# Patient Record
Sex: Female | Born: 1980 | Race: Black or African American | Hispanic: No | Marital: Single | Smoking: Current every day smoker
Health system: Southern US, Community
[De-identification: ages and names within clinical notes are randomized; demographics above are authoritative.]

## PROBLEM LIST (undated history)

## (undated) DIAGNOSIS — D649 Anemia, unspecified: Secondary | ICD-10-CM

## (undated) HISTORY — PX: TUBAL LIGATION: SHX77

---
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
## 2001-10-20 ENCOUNTER — Inpatient Hospital Stay (HOSPITAL_COMMUNITY): Admission: AD | Admit: 2001-10-20 | Discharge: 2001-10-20 | Payer: Self-pay | Admitting: Obstetrics

## 2001-11-10 ENCOUNTER — Inpatient Hospital Stay (HOSPITAL_COMMUNITY): Admission: AD | Admit: 2001-11-10 | Discharge: 2001-11-12 | Payer: Self-pay | Admitting: Obstetrics

## 2001-11-10 ENCOUNTER — Encounter (INDEPENDENT_AMBULATORY_CARE_PROVIDER_SITE_OTHER): Payer: Self-pay | Admitting: Specialist

## 2002-12-17 ENCOUNTER — Ambulatory Visit (HOSPITAL_COMMUNITY): Admission: RE | Admit: 2002-12-17 | Discharge: 2002-12-17 | Payer: Self-pay | Admitting: Obstetrics

## 2003-02-28 ENCOUNTER — Emergency Department (HOSPITAL_COMMUNITY): Admission: EM | Admit: 2003-02-28 | Discharge: 2003-02-28 | Payer: Self-pay | Admitting: Emergency Medicine

## 2003-02-28 ENCOUNTER — Encounter: Payer: Self-pay | Admitting: Emergency Medicine

## 2004-06-09 ENCOUNTER — Ambulatory Visit: Payer: Self-pay | Admitting: Family Medicine

## 2004-07-15 ENCOUNTER — Other Ambulatory Visit: Admission: RE | Admit: 2004-07-15 | Discharge: 2004-07-15 | Payer: Self-pay | Admitting: Family Medicine

## 2004-07-15 ENCOUNTER — Ambulatory Visit: Payer: Self-pay | Admitting: Sports Medicine

## 2004-07-19 ENCOUNTER — Ambulatory Visit: Payer: Self-pay | Admitting: Family Medicine

## 2005-05-11 ENCOUNTER — Other Ambulatory Visit: Admission: RE | Admit: 2005-05-11 | Discharge: 2005-05-11 | Payer: Self-pay | Admitting: Family Medicine

## 2005-05-11 ENCOUNTER — Encounter (INDEPENDENT_AMBULATORY_CARE_PROVIDER_SITE_OTHER): Payer: Self-pay | Admitting: Specialist

## 2005-05-11 ENCOUNTER — Ambulatory Visit: Payer: Self-pay | Admitting: Family Medicine

## 2005-06-12 ENCOUNTER — Ambulatory Visit: Payer: Self-pay | Admitting: Family Medicine

## 2005-06-13 ENCOUNTER — Ambulatory Visit: Payer: Self-pay | Admitting: Family Medicine

## 2005-06-29 ENCOUNTER — Ambulatory Visit: Payer: Self-pay | Admitting: Family Medicine

## 2005-07-26 ENCOUNTER — Emergency Department (HOSPITAL_COMMUNITY): Admission: EM | Admit: 2005-07-26 | Discharge: 2005-07-26 | Payer: Self-pay | Admitting: Family Medicine

## 2005-07-27 ENCOUNTER — Ambulatory Visit: Payer: Self-pay | Admitting: Family Medicine

## 2005-08-24 ENCOUNTER — Emergency Department (HOSPITAL_COMMUNITY): Admission: EM | Admit: 2005-08-24 | Discharge: 2005-08-24 | Payer: Self-pay | Admitting: Family Medicine

## 2005-11-21 ENCOUNTER — Encounter (INDEPENDENT_AMBULATORY_CARE_PROVIDER_SITE_OTHER): Payer: Self-pay | Admitting: *Deleted

## 2005-11-21 LAB — CONVERTED CEMR LAB

## 2005-12-08 ENCOUNTER — Ambulatory Visit: Payer: Self-pay | Admitting: Family Medicine

## 2005-12-20 ENCOUNTER — Ambulatory Visit: Payer: Self-pay | Admitting: Family Medicine

## 2005-12-20 ENCOUNTER — Encounter (INDEPENDENT_AMBULATORY_CARE_PROVIDER_SITE_OTHER): Payer: Self-pay | Admitting: Specialist

## 2005-12-20 ENCOUNTER — Other Ambulatory Visit: Admission: RE | Admit: 2005-12-20 | Discharge: 2005-12-20 | Payer: Self-pay | Admitting: Family Medicine

## 2006-02-13 ENCOUNTER — Ambulatory Visit: Payer: Self-pay | Admitting: Family Medicine

## 2006-05-14 ENCOUNTER — Ambulatory Visit: Payer: Self-pay | Admitting: Family Medicine

## 2006-06-01 ENCOUNTER — Ambulatory Visit: Payer: Self-pay | Admitting: Family Medicine

## 2006-06-28 ENCOUNTER — Ambulatory Visit: Payer: Self-pay | Admitting: Gynecology

## 2006-07-04 ENCOUNTER — Ambulatory Visit: Payer: Self-pay | Admitting: Obstetrics and Gynecology

## 2006-07-04 ENCOUNTER — Other Ambulatory Visit: Admission: RE | Admit: 2006-07-04 | Discharge: 2006-07-04 | Payer: Self-pay | Admitting: Obstetrics and Gynecology

## 2006-07-18 ENCOUNTER — Ambulatory Visit: Payer: Self-pay | Admitting: Obstetrics & Gynecology

## 2006-09-08 ENCOUNTER — Emergency Department (HOSPITAL_COMMUNITY): Admission: EM | Admit: 2006-09-08 | Discharge: 2006-09-08 | Payer: Self-pay | Admitting: Family Medicine

## 2006-09-20 DIAGNOSIS — J309 Allergic rhinitis, unspecified: Secondary | ICD-10-CM | POA: Insufficient documentation

## 2006-09-20 DIAGNOSIS — E669 Obesity, unspecified: Secondary | ICD-10-CM

## 2006-09-20 DIAGNOSIS — F329 Major depressive disorder, single episode, unspecified: Secondary | ICD-10-CM

## 2006-09-20 DIAGNOSIS — F17200 Nicotine dependence, unspecified, uncomplicated: Secondary | ICD-10-CM | POA: Insufficient documentation

## 2006-09-21 ENCOUNTER — Encounter (INDEPENDENT_AMBULATORY_CARE_PROVIDER_SITE_OTHER): Payer: Self-pay | Admitting: *Deleted

## 2007-03-02 ENCOUNTER — Emergency Department (HOSPITAL_COMMUNITY): Admission: EM | Admit: 2007-03-02 | Discharge: 2007-03-02 | Payer: Self-pay | Admitting: Emergency Medicine

## 2007-03-11 ENCOUNTER — Encounter: Payer: Self-pay | Admitting: *Deleted

## 2007-05-10 ENCOUNTER — Encounter: Payer: Self-pay | Admitting: Obstetrics & Gynecology

## 2007-05-10 ENCOUNTER — Ambulatory Visit: Payer: Self-pay | Admitting: Obstetrics & Gynecology

## 2007-05-10 LAB — CONVERTED CEMR LAB: Pap Smear: ABNORMAL

## 2007-05-26 ENCOUNTER — Emergency Department (HOSPITAL_COMMUNITY): Admission: EM | Admit: 2007-05-26 | Discharge: 2007-05-26 | Payer: Self-pay | Admitting: Family Medicine

## 2007-09-21 ENCOUNTER — Emergency Department (HOSPITAL_COMMUNITY): Admission: EM | Admit: 2007-09-21 | Discharge: 2007-09-21 | Payer: Self-pay | Admitting: Family Medicine

## 2007-09-27 ENCOUNTER — Encounter: Payer: Self-pay | Admitting: Family Medicine

## 2008-03-04 ENCOUNTER — Emergency Department (HOSPITAL_COMMUNITY): Admission: EM | Admit: 2008-03-04 | Discharge: 2008-03-04 | Payer: Self-pay | Admitting: Emergency Medicine

## 2008-03-17 ENCOUNTER — Encounter: Payer: Self-pay | Admitting: *Deleted

## 2008-07-08 ENCOUNTER — Emergency Department (HOSPITAL_COMMUNITY): Admission: EM | Admit: 2008-07-08 | Discharge: 2008-07-08 | Payer: Self-pay | Admitting: Emergency Medicine

## 2008-07-08 ENCOUNTER — Telehealth: Payer: Self-pay | Admitting: *Deleted

## 2008-09-10 ENCOUNTER — Encounter: Payer: Self-pay | Admitting: Family Medicine

## 2008-09-10 ENCOUNTER — Ambulatory Visit: Payer: Self-pay | Admitting: Family Medicine

## 2008-09-10 LAB — CONVERTED CEMR LAB
Bilirubin Urine: NEGATIVE
Blood in Urine, dipstick: NEGATIVE
Chlamydia, DNA Probe: NEGATIVE
GC Probe Amp, Genital: NEGATIVE
Glucose, Urine, Semiquant: NEGATIVE
Ketones, urine, test strip: NEGATIVE
Nitrite: NEGATIVE
Protein, U semiquant: NEGATIVE
Specific Gravity, Urine: 1.02
Urobilinogen, UA: 1
WBC Urine, dipstick: NEGATIVE
pH: 7

## 2008-09-20 ENCOUNTER — Encounter: Payer: Self-pay | Admitting: Family Medicine

## 2009-03-09 ENCOUNTER — Encounter: Payer: Self-pay | Admitting: *Deleted

## 2009-03-09 ENCOUNTER — Telehealth: Payer: Self-pay | Admitting: *Deleted

## 2009-06-21 ENCOUNTER — Emergency Department (HOSPITAL_COMMUNITY): Admission: EM | Admit: 2009-06-21 | Discharge: 2009-06-21 | Payer: Self-pay | Admitting: Family Medicine

## 2009-08-05 ENCOUNTER — Other Ambulatory Visit: Admission: RE | Admit: 2009-08-05 | Discharge: 2009-08-05 | Payer: Self-pay | Admitting: Family Medicine

## 2009-08-05 ENCOUNTER — Ambulatory Visit: Payer: Self-pay | Admitting: Family Medicine

## 2009-08-05 ENCOUNTER — Encounter: Payer: Self-pay | Admitting: Family Medicine

## 2009-08-05 LAB — CONVERTED CEMR LAB

## 2009-08-16 ENCOUNTER — Telehealth: Payer: Self-pay | Admitting: *Deleted

## 2009-08-17 ENCOUNTER — Ambulatory Visit: Payer: Self-pay | Admitting: Family Medicine

## 2009-08-17 LAB — CONVERTED CEMR LAB
Bilirubin Urine: NEGATIVE
Glucose, Urine, Semiquant: NEGATIVE
Specific Gravity, Urine: 1.02
Urobilinogen, UA: 0.2
Whiff Test: NEGATIVE

## 2009-11-11 ENCOUNTER — Ambulatory Visit: Payer: Self-pay | Admitting: Family Medicine

## 2009-11-11 ENCOUNTER — Ambulatory Visit (HOSPITAL_COMMUNITY): Admission: RE | Admit: 2009-11-11 | Discharge: 2009-11-11 | Payer: Self-pay | Admitting: Family Medicine

## 2009-11-11 LAB — CONVERTED CEMR LAB
Nitrite: NEGATIVE
Protein, U semiquant: NEGATIVE
pH: 5.5

## 2009-11-18 ENCOUNTER — Telehealth: Payer: Self-pay | Admitting: Family Medicine

## 2010-07-14 ENCOUNTER — Encounter: Payer: Self-pay | Admitting: Family Medicine

## 2010-08-25 NOTE — Assessment & Plan Note (Signed)
Summary: hurt leg & discharge,df   Vital Signs:  Patient profile:   30 year old female Weight:      232 pounds Temp:     98.6 degrees F Pulse rate:   81 / minute BP sitting:   112 / 73  Vitals Entered By: Jone Baseman CMA (November 11, 2009 3:46 PM)  CC: Hurt leg and discharge Is Patient Diabetic? No Pain Assessment Patient in pain? no        Primary Care Provider:  Eustaquio Boyden  MD  CC:  Hurt leg and discharge.  History of Present Illness: CC: leg pain and discharge  1. leg pain - 2 mo ago fell down stairs (8 stairs).  Never iced, did take motrin/ibuprofen.  Initially had big bruise, always had bad swelling, but pain improved.  Started hurting again 1 wk ago.  No pain with gait, more with pushing or hitting leg there.  2. discharge - several day h/o vag discharge, itching, slight dysuria.  No odor.  "cottage cheese like".  + urgency but not increased frequency.  3. smoking - 1/4 ppd.    Habits & Providers  Alcohol-Tobacco-Diet     Tobacco Status: current     Tobacco Counseling: to quit use of tobacco products     Cigarette Packs/Day: 0.25  Current Medications (verified): 1)  Fluconazole 150 Mg Tabs (Fluconazole) .... One By Mouth X 1  Allergies (verified): 1)  ! Penicillin  Past History:  Past Medical History: BV (11/05) (07/2009) H/o anemia w/ G3 NSVD x 3 h/o cervical dysplasia PMH-FH-SH reviewed for relevance  Social History: Packs/Day:  0.25  Physical Exam  General:  alert, well-developed, and well-nourished.  alert, well-developed, and well-nourished.   Genitalia:  Normal introitus for age, no external lesions, no vaginal discharge, mucosa pink and moist, no vaginal or cervical lesions, no vaginal atrophy, no friaility or hemorrhage, slight white discharge. Msk:  tender swelling over lateral left leg, mid fibular.  No pain at ankle or knee.   Pulses:  2+ periph pulses Extremities:  No clubbing, cyanosis, edema, or deformity noted with normal  full range of motion of all joints.   Skin:  Intact without suspicious lesions or rashes   Impression & Recommendations:  Problem # 1:  LEUKORRHEA (ICD-623.5) wet prep negative for yeast, however pt endorses "cottage cheese" discharge.  Will treat with diflucan. The following medications were removed from the medication list:    Metrogel-vaginal 0.75 % Gel (Metronidazole) ..... Use 1 applicatorful in your vagina every night for 5 days in  Orders: Wet Prep- [facility] (87210) [facility]- Est  Level 4 (16109)  Problem # 2:  LEG PAIN, LEFT (ICD-729.5) obtain xray of leg to eval.  if hematoma, may slowly resolve.  If mid fibular fracture, may need further intervention.  For now, nsaids.  cell phone [phone]  Orders: Diagnostic X-Ray/Fluoroscopy (Diagnostic X-Ray/Flu) [facility]- Est  Level 4 (81191)  Problem # 3:  DYSURIA (ICD-788.1)  UA - mod LE and 5-10 WBC.  Treat as UTI given some sxs with macrobid x 5 days (all to PCN so held off on keflex).  no concern for pyelo.  Orders: Urinalysis-[facility] (00000) [facility]- Est  Level 4 (47829)  Her updated medication list for this problem includes:    Nitrofurantoin Monohyd Macro 100 Mg Caps (Nitrofurantoin monohyd macro) ..... One by mouth two times a day x 5 days  Complete Medication List: 1)  Fluconazole 150 Mg Tabs (Fluconazole) .... One by mouth x 1 2)  Nitrofurantoin Monohyd Macro 100 Mg Caps (Nitrofurantoin monohyd macro) .... One by mouth two times a day x 5 days  Patient Instructions: 1)  Sounds like you have a yeast infection.  However, wait until I call you to fill the medicine because we have also checked a urinalysis today. 2)  we will get xrays of your left leg. 3)  Try ibuprofen for the let pain, as well as icing for no more than 15 min at a time. Prescriptions: NITROFURANTOIN MONOHYD MACRO 100 MG CAPS (NITROFURANTOIN MONOHYD MACRO) one by mouth two times a day x 5 days  #10 x 0   Entered and Authorized by:   Eustaquio Boyden  MD   Signed by:    Eustaquio Boyden  MD on 11/11/2009   Method used:   Faxed to ...       [facility] Department (retail)       [address] [place], [place]  [address]       Ph: [phone]       Fax: [phone]   RxID:   [id number] FLUCONAZOLE 150 MG TABS (FLUCONAZOLE) one by mouth x 1  #1 x 0   Entered and Authorized by:   Eustaquio Boyden  MD   Signed by:   Eustaquio Boyden  MD on 11/11/2009   Method used:   Faxed to ...       [facility] Department (retail)       [address] [place], [place]  [address]       Ph: [phone]       Fax: [phone]   RxID:   [id number]    Prevention & Chronic Care Immunizations   Influenza vaccine: Not documented    Tetanus booster: 04/23/2006: Done.   Tetanus booster due: 04/23/2016    Pneumococcal vaccine: Not documented  Other Screening   Pap smear: NEGATIVE FOR INTRAEPITHELIAL LESIONS OR MALIGNANCY.  (08/05/2009)   Pap smear due: 11/08/2007   Smoking status: current  (11/11/2009)   Smoking cessation counseling: yes  (09/10/2008)  Laboratory Results   Urine Tests  Date/Time Received: November 11, 2009 4:47 PM  Date/Time Reported: November 11, 2009 5:13 PM   Routine Urinalysis   Color: yellow Appearance: Clear Glucose: negative   (Normal Range: Negative) Bilirubin: negative   (Normal Range: Negative) Ketone: negative   (Normal Range: Negative) Spec. Gravity: 1.025   (Normal Range: 1.003-1.035) Blood: trace-intact   (Normal Range: Negative) pH: 5.5   (Normal Range: 5.0-8.0) Protein: negative   (Normal Range: Negative) Urobilinogen: 0.2   (Normal Range: 0-1) Nitrite: negative   (Normal Range: Negative) Leukocyte Esterace: moderate   (Normal Range: Negative)  Urine Microscopic WBC/HPF: 5-12 RBC/HPF: 0-3 Bacteria/HPF: 1+ Epithelial/HPF: 1-5    Comments: biochemical ...........test performed by..........Marland Kitchen Ashlee Dais, SMA micro ...............test performed by......Marland KitchenBonnie A. Swazil[place], MLS  (ASCP)cm  Date/Time Received: November 11, 2009 4:20 PM  Date/Time Reported: November 11, 2009 4:50 PM   [employer] Source: vag WBC/hpf: 0-2 Bacteria/hpf: 1+  Cocci Clue cells/hpf: rare Yeast/hpf: none Trichomonas/hpf: none Comments: ...........test performed by..........Marland Kitchen [place], SMA

## 2010-08-25 NOTE — Assessment & Plan Note (Signed)
Summary: f/u pap,df   Vital Signs:  Patient profile:   30 year old female Weight:      226.3 pounds Temp:     98.1 degrees F oral Pulse rate:   91 / minute Pulse rhythm:   regular BP sitting:   129 / 86  (left arm) Cuff size:   large  Vitals Entered By: Loralee Pacas CMA (August 05, 2009 9:35 AM) CC: cpe with pap   Primary Care Provider:  Eustaquio Boyden  MD  CC:  cpe with pap.  History of Present Illness: Pt has noticed after intercourse that she has an odor.  + discharge. Would also like STI testing.  Sx present for 2 months.  Uses feminine washes at times.  Had BTL in 2004.  LMP was December 16th.  Usually very regular.  Missed one month a few months ago, but regular since then.   Smoking.  Has tried to quit in the past.  Considering quitting again.  Concerned about gaining weight.  Current Medications (verified): 1)  None  Allergies (verified): 1)  ! Penicillin  Review of Systems       see HPI  Physical Exam  General:  Obese.  No distress.  Vitals reviewed. Genitalia:  Pelvic Exam:        External: normal female genitalia without lesions or masses        Vagina: normal without lesions or masses.  scant discharge noted.        Cervix: normal without lesions or masses        Adnexa: normal bimanual exam without masses or fullness        Uterus: normal by palpation.  No CMT        Pap smear: performed.  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  Check pap today ers: Pap Smear-[facility] ([id number])  Problem # 2:  BACTERIAL VAGINITIS (ICD-616.10)  Pt unable to decide gel vs. oral rx.  Both rx faxed to pharmacy so she can choose based on price and availability. Her updated medication list for this problem includes:    Metronidazole 500 Mg Tabs (Metronidazole) .Marland Kitchen... Take 1 by mouth twice daily for the infection.    Metrogel-vaginal  0.75 % Gel (Metronidazole) ..... Use 1 applicatorful in your vagina every night for 5 days in  Orders: [facility]- Est Level  3 (16109)  Problem # 3:  TOBACCO DEPENDENCE (ICD-305.1)  Counseled on cessation.  Offered classes at [facility] and [employer].  Orders: [facility]- Est Level  3 (99213)  Problem # 4:  SEXUALLY TRANSMITTED DISEASE, EXPOSURE TO (ICD-V01.6)  Check Gc/Cz.  PT declined blood draw for RPR and HIV.  Condoms advised.  Advised  Metrogel may interfere with condoms.  Orders: [facility]- Est Level  3 (60454)  Complete Medication List: 1)  Metronidazole 500 Mg Tabs (Metronidazole) .... Take 1 by mouth twice daily for the infection. 2)  Metrogel-vaginal 0.75 % Gel (Metronidazole) .... Use 1 applicatorful in your vagina every night for 5 days in  [facility]: [facility] GC/Chlamydia-[facility] (87591/87491) Prescriptions: METROGEL-VAGINAL 0.75 % GEL (METRONIDAZOLE) Use 1 applicatorful in your vagina every night for 5 days in  #1 tube x 0   Entered and Authorized by:   Sarah Swaziland MD   Signed by:   Sarah Swaziland MD on 08/05/2009   Method used:   Faxed to .Marland KitchenMarland Kitchen  [facility] (retail)             [place], [place]         Ph:        Fax: [phone]   RxID:   [id number] METRONIDAZOLE 500 MG TABS (METRONIDAZOLE) Take 1 by mouth twice daily for the infection.  #14 x 0   Entered and Authorized by:   Sarah Swaziland MD   Signed by:   Sarah Swaziland MD on 08/05/2009   Method used:   Faxed to ...       [facility] (retail)             [place], [place]         Ph:        Fax: [phone]   RxID:   [id number]   Laboratory Results  Date/Time Received: August 05, 2009 10:14 AM  Date/Time Reported: August 05, 2009 10:29 AM   [place] Source: vaginal WBC/hpf: 0-3 Bacteria/hpf: 3+  Cocci Clue cells/hpf: few  Positive whiff Yeast/hpf: none Trichomonas/hpf: none Comments: ...........test performed by...........Marland KitchenTerese Door, CMA

## 2010-08-25 NOTE — Progress Notes (Signed)
Summary: results  Phone Note Call from Patient Call back at [phone]   Caller: Patient Summary of Call: pt is wanting results of xray Initial call taken by: De Nurse,  November 18, 2009 11:52 AM  Follow-up for Phone Call        told her negative for fx or dislocation Follow-up by: Golden Circle RN,  November 18, 2009 11:58 AM  Additional Follow-up for Phone Call Additional follow up Details #1::        thanks.  wanted to discuss soft tissue in xray - looks like still has swelling in soft tissue, and if bothering her, could consider PT to left leg.  will see what she thinks. Additional Follow-up by: Eustaquio Boyden  MD,  November 18, 2009 12:14 PM    Additional Follow-up for Phone Call Additional follow up Details #2::    talked with patient.  if worsening, will set up with PT for leg.  recommendedwarm compresses and NSAIDs.  disucssed likely hematoma in soft tissue, discussed difference between this and blood clots. Follow-up by: Eustaquio Boyden  MD,  November 19, 2009 11:03 AM

## 2010-08-25 NOTE — Assessment & Plan Note (Signed)
Summary: vag irritation/[place]/Gutierrez   Vital Signs:  Patient profile:   30 year old female Weight:      227.3 pounds Temp:     98.7 degrees F oral Pulse rate:   73 / minute BP sitting:   117 / 77  (right arm) Cuff size:   large  Vitals Entered By: Garen Grams LPN (August 17, 2009 10:04 AM) CC: possible yeast inf, Vaginal discharge Is Patient Diabetic? No Pain Assessment Patient in pain? no        Primary Care Provider:  Eustaquio Boyden  MD  CC:  possible yeast inf and Vaginal discharge.  History of Present Illness:  Vaginal discharge      This is a 30 year old woman who presents with Vaginal discharge.  The patient complains of itching and vaginal burning, but denies frequency, urgency, fever, pelvic pain, and back pain.  The discharge is described as white.  Risk factors for vaginal discharge include recent antibiotics and missed period.  The patient denies the following symptoms: genital sores, painful intercourse, rash, and headache.  Prior treatment has included metronidazole.   Not on any birth control, sexually active, no condom use.  LMP 07/08/2010. Cycles are normally very regular.  Hers is 9 days late.  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 Use 1 Applicatorful in  Your Vagina Every Night For 5 Days in  Allergies (verified): 1)  ! Penicillin  Past History:  Past Medical History: Last updated: 09/10/2008 BV (11/05) H/o anemia w/ G3 NSVD x 3 h/o cervical dysplasia  Past Surgical History: Last updated: 09/10/2008 BTL (2004)  Colposcopy: CIN I, II   7/07 Hgb 13.7, random glucose 64, Cr 0.8 - 06/19/2004 LEEP 12/07 CINI-CINII TSH = 1.634 - 06/19/2004  Family History: Last updated: 09/10/2008 Father--living; healthy GGM--DM, Large FHx of HTN Mother--living; CAD, asthma, DJD  Social History: Last updated: 09/10/2008 -Single, but in relationship with w/ father of G2 & G3 who is incarcerated -Lives w/ her 3 daughters (born in 1999, 2000, 2003) -works at [employer]; receives child support from father of G1 -Has GED; went to [employer] x 1 semester; 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    Physical Exam  General:  alert, well-developed, and well-nourished.  alert, well-developed, and well-nourished.   Head:  normocephalic and atraumatic.  normocephalic and atraumatic.   Abdomen:  soft, non-tender, and normal bowel sounds.  soft,  non-tender, and normal bowel sounds.   Genitalia:  normal introitus.  normal introitus.  Minimal vaginal disharge.     Impression & Recommendations:  Problem # 1:  BACTERIAL VAGINITIS (ICD-616.10) Wet prep is not c/w yeast or BV.  There is an occasional yeast but many bacteria and WBC's, and sperm.  No clue cells or trich  noted.  UPT is negative.  Will refrain from treatment at this time.  Pt. may use OTC medication for yeast, but I am unconvinced of this diagnosis. Good vaginal health measures emphasized with the pt.  discussed results by phone.  She is ok with no treatment at present time.  Complete Medication List: 1)  Metrogel-vaginal 0.75 % Gel (Metronidazole) .... Use 1 applicatorful in your vagina every night for 5 days in  Other Orders: Urinalysis-[facility] (00000) U Preg-[facility] (98119) GC/Chlamydia-[facility] (87591/87491) [facility]  Patient Instructions: 1)  Please schedule a follow-up appointment as needed .   Laboratory Results   Urine Tests  Date/Time Received: August 17, 2009 10:55 AM  Date/Time Reported: August 17, 2009 11:38 AM   Routine Urinalysis   Color: yellow Appearance: Clear Glucose: negative   (Normal Range: Negative) Bilirubin: negative   (Normal Range: Negative) Ketone: negative   (Normal Range: Negative) Spec. Gravity: 1.020   (Normal Range: 1.003-1.035) Blood: trace-intact   (Normal Range: Negative) pH: 7.0   (Normal Range: 5.0-8.0) Protein: negative   (Normal Range: Negative) Urobilinogen: 0.2   (Normal Range: 0-1) Nitrite: negative   (Normal Range: Negative) Leukocyte Esterace: moderate   (Normal Range: Negative)  Urine Microscopic WBC/HPF: 10-20 RBC/HPF: 1-5 Bacteria/HPF: 2+ Epithelial/HPF: 10-20 Casts/LPF: triple phosphate crystals    Urine HCG: negative Comments: biochemical ............test performed by...........Marland Kitchen Terese Door, CMA micro ...............test performed by......Marland KitchenBonnie A. Swazil[place], MLS (ASCP)cm  Date/Time Received: August 17, 2009 11:17 AM  Date/Time Reported: August 17, 2009 11:39 AM   [place] Source: vag WBC/hpf: >20 Bacteria/hpf: 3+ cocci and rods Clue cells/hpf: none  Negative whiff Yeast/hpf: few Trichomonas/hpf: none Comments: sperm present, few intermediate and parabasal cells present ...............test performed  by......Marland KitchenBonnie A. [place], MLS (ASCP)cm      Appended Document: vag irritation/[place]/Gutierrez

## 2010-08-25 NOTE — Miscellaneous (Signed)
  Clinical Lists Changes  Problems: Removed problem of DYSURIA (ICD-788.1) Removed problem of LEG PAIN, LEFT (ICD-729.5) Removed problem of LEUKORRHEA (ICD-623.5) Removed problem of CONTRACEPTIVE MANAGEMENT (ICD-V25.09) Removed problem of SEXUALLY TRANSMITTED DISEASE, EXPOSURE TO (ICD-V01.6) Removed problem of BACTERIAL VAGINITIS (ICD-616.10) Removed problem of VULVAR ABSCESS (ICD-616.4) Removed problem of SKIN RASH (ICD-782.1) Removed problem of PERSONAL HISTORY OF CERVICAL DYSPLASIA (ICD-V13.22) Removed problem of CONTACT OR EXPOSURE TO OTHER VIRAL DISEASES (ICD-V01.79) Removed problem of SCREENING FOR MALIGNANT NEOPLASM OF THE CERVIX (ICD-V76.2) Removed problem of DYSURIA (ICD-788.1)

## 2010-08-25 NOTE — Progress Notes (Signed)
Summary: triage  Phone Note Call from Patient Call back at [phone]   Caller: Patient Summary of Call: Pt took rx for a bacterial inf now thinks it has given her a yeast inf.  Does she have to come in or can something be called in for her. Initial call taken by: Clydell Hakim,  August 16, 2009 1:40 PM  Follow-up for Phone Call        appt at 8:30am tomorrow Follow-up by: Golden Circle RN,  August 16, 2009 2:10 PM

## 2010-09-14 ENCOUNTER — Encounter: Payer: Self-pay | Admitting: *Deleted

## 2010-12-06 NOTE — Group Therapy Note (Signed)
Cynthia Mcclure, Cynthia Mcclure NO.:  [id number]   MEDICAL RECORD NO.:  [id number]          PATIENT TYPE:  WOC   LOCATION:  [facility]                   FACILITY:  [facility]   PHYSICIAN:  Johnella Moloney, MD        DATE OF BIRTH:  05/13/1981   DATE OF SERVICE:  05/10/2007                                  CLINIC NOTE   The patient is a 30 year old gravida 3, para 3 who is here for a  followup Pap smear.  The patient did have a LEEP in December of 2007,  and the pathology revealed variable dysplasia ranging from CIN1 to focal  CIN2, and dysplasia involved in cervical margin and also endocervical  glands.  The patient also reports having heavier bleeding the first 2  days of her period and dysmenorrhea, and was wondering what she could  take to alleviate the dysmenorrhea.  She denies any lightheadedness,  dizziness, or any other symptoms, and reports using about 4-5 pads a  day.  She is not worried about the heavier bleeding on the first 2 days  of her cycle, just the dysmenorrhea that is associated with it.   PHYSICAL EXAMINATION:  Temperature 97.7, pulse 70, blood pressure  114/75, weight 208.3 pounds.  Height 5 feet 1-3/4 inches.  IN GENERAL:  No apparent distress.  BREAST:  Symmetric bilaterally.  No abnormal masses or skin changes  noted.  No abnormal drainage noted.  ABDOMEN:  Soft, nontender, nondistended.  PELVIC EXAMINATION:  Normal external female genitalia.  Well-rugated  pink vagina.  Normal discharge.  Multiparous cervical os noted with no  drainage.  Well-healed LEEP site.  Another Pap sample was obtained.  BIMANUAL EXAM:  No cervical motion tenderness, normal uterus and adnexa.  No abnormal masses or tenderness elicited.   IMPRESSION:  The patient is a 30 year old G3, P3 here for followup for a  history of cervical dysplasia.  She is status post LEEP in December of  2007.  The patient is also reporting dysmenorrhea.   Issue #1:  Cervical dysplasia, will follow up  Pap smear, and if the Pap  smear is abnormal, we will repeat colposcopy, and if it is normal, the  patient will have every 6 month Pap smears for 2 years before resuming  normal Pap smear, that is if the Pap smear results are all normal.  Issue #2:  Dysmenorrhea.  The patient was given a prescription for  ibuprofen 800 mg p.o. q.8 h. p.r.n. pain.  The patient was advised to  take this as needed during her menstrual period, and to contact us if  this is not alleviated by this medication.           ______________________________  Johnella Moloney, MD     UD/MEDQ  D:  05/10/2007  T:  05/11/2007  Job:  [id number]

## 2010-12-09 NOTE — Group Therapy Note (Signed)
NAME:  ADA, HOLNESS NO.:  [id number]   MEDICAL RECORD NO.:  [id number]          PATIENT TYPE:  WOC   LOCATION:  [facility]                   FACILITY:  [facility]   PHYSICIAN:  Dorthula Perfect, MD     DATE OF BIRTH:  February 28, 1981   DATE OF SERVICE:                                  CLINIC NOTE   A 30 year old black female gravida 3, para 3, returns today for results  of her LEEP procedure, which was performed December 12.  The LEEP  procedure was done because of severe CIN-II dysplasia with endocervical  involvement.   Pathology revealed variable dysplasia ranging from CIN-I to focal CIN-  II.  Dysplasia focally involved in the cervical margin.  The  ectocervical margin was negative for dysplasia.  High-grade dysplasia  extends into the endocervical glands.  The ectocervical margin was  negative for dysplasia.   The results were given to the patient.  It was recommended that she have  a Pap smear in 3 months and probably every 3 months for 1 year.  She  understands.   DIAGNOSES:  Cervical intraepithelial neoplasia I, cervical  intraepithelial neoplasia 2 cervical dysplasia.           ______________________________  Dorthula Perfect, MD     ER/MEDQ  D:  07/18/2006  T:  07/18/2006  Job:  [id number]

## 2010-12-09 NOTE — Op Note (Signed)
   NAME:  Cynthia Mcclure, Cynthia Mcclure                    ACCOUNT NO.:  [id number]   MEDICAL RECORD NO.:  [id number]                   PATIENT TYPE:  AMB   LOCATION:  SDC                                  FACILITY:  [facility]   PHYSICIAN:  Kathreen Cosier, M.D.           DATE OF BIRTH:  Feb 25, 1981   DATE OF PROCEDURE:  12/17/2002  DATE OF DISCHARGE:                                 OPERATIVE REPORT   PREOPERATIVE DIAGNOSIS:  Multiparity.   PROCEDURE:  Open laparoscopic tubal sterilization.   SURGEON:  Kathreen Cosier, M.D.   DESCRIPTION OF PROCEDURE:  The patient placed on the operating table in the  lithotomy position.  General anesthesia administered.  Abdomen prepped and  draped.  Bladder emptied with straight catheter.  A weighted speculum placed  in the vagina and the Hulka tenaculum placed in the umbilicus.  A transverse  incision was made and carried down to the fascia.  Fascia was cleaned and  grasped with two Kochers.  Fascia and peritoneum opened with the Mayo  scissors.  The sleeve of the trocar was inserted intraperitoneally.  Three  liters of carbon dioxide were infused intraperitoneally and the visualizing  scope was inserted.  The uterus, tubes and ovaries were noted to be normal.  The cautery probe was inserted to ________.  The right tube grasped 1 cm  from the cornua and cauterized a total of four places moving lateral from  the first site of cautery.  The fimbriated was already noted.  On the right  side, the tube was ______ fimbria and then starting 1 cm from the cornua,  the tube was cauterized in an total of four places moving lateral from the  first site of cautery.  CO2 was allowed to escape from the peritoneal cavity  and the fascia closed with one suture of 0 Dexon.  The skin closed with  subcuticular stitch of 3-0 plain.  The patient tolerated the procedure well  and was taken to the recovery room in good condition.     Kathreen Cosier, M.D.    BAM/MEDQ  D:  12/17/2002  T:  12/17/2002  Job:  [id number]

## 2010-12-09 NOTE — Group Therapy Note (Signed)
NAME:  Cynthia Mcclure, Cynthia Mcclure NO.:  [id number]   MEDICAL RECORD NO.:  [id number]          PATIENT TYPE:  WOC   LOCATION:  [facility]                   FACILITY:  [facility]   PHYSICIAN:  Argentina Donovan, MD        DATE OF BIRTH:  June 10, 1981   DATE OF SERVICE:  07/04/2006                                  CLINIC NOTE   The patient returned for a LEEP conization of the cervix because of a  severe CIN-2 dysplasia with endocervical involvement.  She was counseled  by Dr. Mia Creek the last time she was here.  The discussed all the  possible complications involved.  We reiterated that with the patient.  She is planning on having her regular period within the next few days  but insisted that we do this today in spite of my telling her that she  may have a much heavier periods as a foul discharge.  She watched the  LEEP movie prior to this, and then we answered the questions that she  had.  We covered the risks.  Her mother had a hysterectomy secondary to  severe dysplasia many years ago.   The patient was placed in dorsolithotomy position.  The insulated  speculum placed in the vagina with a lateral retractor to place the  cervix in the mid portion, and it was injected in four quadrants with 1  mL of 1% Xylocaine and 1:200,000 epinephrine in each of the areas, and  using a 2 x 1.5 loop and a 50 watt blend 1 current, the conization was  taken.  The base was coagulated with 50 coag for hemostasis and the area  treated with Monsel's solution.  Speculum was removed, and the patient  was discharged in satisfactory condition to be followed in 2 weeks with  results.           ______________________________  Argentina Donovan, MD     PR/MEDQ  D:  07/04/2006  T:  07/04/2006  Job:  [id number]

## 2011-04-28 LAB — POCT RAPID STREP A: Streptococcus, Group A Screen (Direct): POSITIVE — AB

## 2011-09-20 ENCOUNTER — Encounter (HOSPITAL_COMMUNITY): Payer: Self-pay | Admitting: *Deleted

## 2011-09-20 ENCOUNTER — Emergency Department (INDEPENDENT_AMBULATORY_CARE_PROVIDER_SITE_OTHER)
Admission: EM | Admit: 2011-09-20 | Discharge: 2011-09-20 | Disposition: A | Discharge status: Home / Self Care | Payer: Self-pay | Source: Home / Self Care | Attending: Family Medicine | Admitting: Family Medicine

## 2011-09-20 DIAGNOSIS — H109 Unspecified conjunctivitis: Secondary | ICD-10-CM

## 2011-09-20 MED ORDER — POLYMYXIN B-TRIMETHOPRIM 10000-0.1 UNIT/ML-% OP SOLN: 1.0000 [drp] | OPHTHALMIC | Status: AC

## 2011-09-20 NOTE — ED Notes (Signed)
Cool cloth placed across eyes, warm blanket given to pt.

## 2011-09-20 NOTE — ED Notes (Signed)
Eye tray at bedside.

## 2011-09-20 NOTE — ED Provider Notes (Signed)
History     CSN: [id number]  Arrival date & time 09/20/11  6213   First MD Initiated Contact with Patient 09/20/11 [phone]      Chief Complaint  Patient presents with  . Conjunctivitis    (Consider location/radiation/quality/duration/timing/severity/associated sxs/prior treatment) HPI Comments: Cynthia Mcclure presents for evaluation of bilateral eye redness, pain, burning, tearing and crusting. She is reports photophobia now. She reports her symptoms started in her right eye on Sunday evening. Her right eye aggressively worsened. Symptoms began in her left eye yesterday. She denies any loss of vision. She does report blurry vision at the present.  Patient is a 31 y.o. female presenting with conjunctivitis. The history is provided by the patient.  Conjunctivitis  The current episode started 3 to 5 days ago. The onset was sudden. The problem has been gradually worsening. The problem is moderate. The symptoms are relieved by nothing. The symptoms are aggravated by nothing. Associated symptoms include double vision, eye itching, photophobia, congestion, eye discharge, eye pain and eye redness. Pertinent negatives include no fever, no decreased vision and no rhinorrhea. The eye pain is moderate. There is pain in both eyes.    History reviewed. No pertinent past medical history.  Past Surgical History  Procedure Date  . Tubal ligation     No family history on file.  History  Substance Use Topics  . Smoking status: Current Everyday Smoker -- 0.5 packs/day    Types: Cigarettes  . Smokeless tobacco: Not on file  . Alcohol Use: Yes     socially    OB History    Grav Para Term Preterm Abortions TAB SAB Ect Mult Living                  Review of Systems  Constitutional: Negative.  Negative for fever.  HENT: Positive for congestion. Negative for rhinorrhea.   Eyes: Positive for double vision, photophobia, pain, discharge, redness and itching. Negative for visual disturbance.  Respiratory:  Negative.   Cardiovascular: Negative.   Gastrointestinal: Negative.   Genitourinary: Negative.   Musculoskeletal: Negative.   Skin: Negative.   Neurological: Negative.     Allergies  Penicillins  Home Medications   Current Outpatient Rx  Name Route Sig Dispense Refill  . FLUCONAZOLE 150 MG PO TABS Oral Take 150 mg by mouth once.      Marland Kitchen NITROFURANTOIN MONOHYD MACRO 100 MG PO CAPS Oral Take 100 mg by mouth 2 (two) times daily.      Marland Kitchen POLYMYXIN B-TRIMETHOPRIM 10000-0.1 UNIT/ML-% OP SOLN Both Eyes Place 1 drop into both eyes every 4 (four) hours. 10 mL 0    BP 107/46  Pulse 70  Temp(Src) 98.7 F (37.1 C) (Oral)  Resp 18  SpO2 100%  LMP 08/20/2011  Physical Exam  Nursing note and vitals reviewed. Constitutional: She is oriented to person, place, and time. She appears well-developed and well-nourished.  HENT:  Head: Normocephalic and atraumatic.  Eyes: EOM are normal. Pupils are equal, round, and reactive to light. Right eye exhibits discharge and exudate. Right eye exhibits no hordeolum. Left eye exhibits discharge and exudate. Left eye exhibits no hordeolum. Right conjunctiva is injected. Right conjunctiva has a hemorrhage. Left conjunctiva is injected.  Neck: Normal range of motion.  Pulmonary/Chest: Effort normal.  Musculoskeletal: Normal range of motion.  Neurological: She is alert and oriented to person, place, and time.  Skin: Skin is warm and dry.  Psychiatric: Her behavior is normal.    ED Course  Procedures (including critical  care time)  Labs Reviewed - No data to display No results found.   1. Conjunctivitis, both eyes       MDM  rx given for Polytrim; precautions given to return to ED or call ophthalmologist on-call if symptoms worsen in any way        Richardo Priest, MD 09/20/11 1029

## 2011-09-20 NOTE — Discharge Instructions (Signed)
Use eyedrops as directed. Exercise proper hygiene with handwashing, not touching the face or eye, not sharing towels or other clothing. Return to care, to the Emergency Department, or call the Ophthalmology provider listed on your discharge papers, should your symptoms not improve, or worsen in any way, or any visual disturbance or loss of vision.

## 2011-09-20 NOTE — ED Notes (Signed)
Pt states 2 days ago right eye started with swelling, burning, itching and draining, left eye started yesterday.  Both eyes very swollen and red.

## 2012-05-29 ENCOUNTER — Encounter (HOSPITAL_COMMUNITY): Payer: Self-pay | Admitting: Adult Health

## 2012-05-29 ENCOUNTER — Emergency Department (HOSPITAL_COMMUNITY)
Admission: EM | Admit: 2012-05-29 | Discharge: 2012-05-29 | Disposition: A | Discharge status: Home / Self Care | Payer: Self-pay | Attending: Emergency Medicine | Admitting: Emergency Medicine

## 2012-05-29 DIAGNOSIS — R51 Headache: Secondary | ICD-10-CM | POA: Insufficient documentation

## 2012-05-29 DIAGNOSIS — F17200 Nicotine dependence, unspecified, uncomplicated: Secondary | ICD-10-CM | POA: Insufficient documentation

## 2012-05-29 DIAGNOSIS — H00019 Hordeolum externum unspecified eye, unspecified eyelid: Secondary | ICD-10-CM | POA: Insufficient documentation

## 2012-05-29 MED ORDER — TOBRAMYCIN 0.3 % OP SOLN: 1.0000 [drp] | OPHTHALMIC | Status: DC

## 2012-05-29 MED ORDER — TOBRAMYCIN 0.3 % OP OINT: TOPICAL_OINTMENT | Freq: Once | OPHTHALMIC | Status: DC

## 2012-05-29 NOTE — ED Provider Notes (Signed)
Medical screening examination/treatment/procedure(s) were performed by non-physician practitioner and as supervising physician I was immediately available for consultation/collaboration.    Nelia Shi, MD 05/29/12 2322

## 2012-05-29 NOTE — ED Provider Notes (Signed)
History     CSN: [id number]  Arrival date & time 05/29/12  2054   First MD Initiated Contact with Patient 05/29/12 2236      Chief Complaint  Patient presents with  . Headache  . Eye Pain    (Consider location/radiation/quality/duration/timing/severity/associated sxs/prior treatment) HPI Comments: 31 year old female presents the emergency department complaining of left-sided eye pain and swelling after she woke up this morning. States the swelling worsened throughout the day while she was at work, and she began to develop a headache and blurred vision in her left eye. Blurred vision has since subsided. States she has allergies and is constantly itching her eyes. She took her allergy pill this morning to see if that would help, however it did not. Admits to clear discharge from the left eye. Denies fever, chills, nausea or vomiting.  Patient is a 31 y.o. female presenting with headaches and eye pain. The history is provided by the patient and a parent.  Headache  Pertinent negatives include no fever and no shortness of breath.  Eye Pain Associated symptoms include headaches. Pertinent negatives include no chest pain, chills, fever, neck pain or rash.    History reviewed. No pertinent past medical history.  Past Surgical History  Procedure Date  . Tubal ligation     History reviewed. No pertinent family history.  History  Substance Use Topics  . Smoking status: Current Every Day Smoker -- 0.5 packs/day    Types: Cigarettes  . Smokeless tobacco: Not on file  . Alcohol Use: Yes     Comment: socially    OB History    Grav Para Term Preterm Abortions TAB SAB Ect Mult Living                  Review of Systems  Constitutional: Negative for fever and chills.  HENT: Negative for neck pain and neck stiffness.   Eyes: Positive for pain, discharge, redness and visual disturbance.  Respiratory: Negative for shortness of breath.   Cardiovascular: Negative for chest pain.    Gastrointestinal: Negative.   Musculoskeletal: Negative.   Skin: Negative for rash.  Neurological: Positive for headaches.  Psychiatric/Behavioral: Negative for confusion.    Allergies  Penicillins  Home Medications   Current Outpatient Rx  Name  Route  Sig  Dispense  Refill  . FLUCONAZOLE 150 MG PO TABS   Oral   Take 150 mg by mouth once.           Marland Kitchen NITROFURANTOIN MONOHYD MACRO 100 MG PO CAPS   Oral   Take 100 mg by mouth 2 (two) times daily.             BP 108/66  Pulse 66  Temp 98.4 F (36.9 C) (Oral)  Resp 18  SpO2 97%  Physical Exam  Constitutional: She is oriented to person, place, and time. She appears well-developed and well-nourished. No distress.  HENT:  Head: Normocephalic and atraumatic.  Mouth/Throat: Oropharynx is clear and moist.  Eyes: EOM are normal. Pupils are equal, round, and reactive to light. No foreign bodies found. Left eye exhibits hordeolum. Left eye exhibits no discharge. Left conjunctiva is injected.       Left eyelid edematous  Neck: Normal range of motion. Neck supple.  Cardiovascular: Regular rhythm and normal heart sounds.   Pulmonary/Chest: Effort normal and breath sounds normal.  Musculoskeletal: Normal range of motion. She exhibits no edema.  Neurological: She is alert and oriented to person, place, and time.  Skin: Skin  is warm and dry.  Psychiatric: She has a normal mood and affect. Her behavior is normal.    ED Course  Procedures (including critical care time)  Labs Reviewed - No data to display No results found.   1. Hordeolum       MDM  31 y/o female with stye. No visual deficits present at this time. Stye visualized on exam. No red flags concerning patient's headache. No concern for acute glaucoma. Advised warm compresses and will prescribe tobrex eye drops.        Trevor Mace, PA-C 05/29/12 2320

## 2012-05-29 NOTE — ED Notes (Addendum)
Presents with left eye redness and edema that began this am after pt woke up associated with left sided headache that developed suddenly in the afternoon and left eye blurred vision. . Pt is alert and oriented and MAEx4.   Answers all questions appropriately.  No slurred speech.

## 2012-05-29 NOTE — ED Notes (Signed)
PA at bedside.

## 2014-07-05 ENCOUNTER — Emergency Department (HOSPITAL_COMMUNITY)
Admission: EM | Admit: 2014-07-05 | Discharge: 2014-07-06 | Disposition: A | Discharge status: Home / Self Care | Payer: Self-pay | Attending: Emergency Medicine | Admitting: Emergency Medicine

## 2014-07-05 ENCOUNTER — Encounter (HOSPITAL_COMMUNITY): Payer: Self-pay | Admitting: Emergency Medicine

## 2014-07-05 DIAGNOSIS — S60222A Contusion of left hand, initial encounter: Secondary | ICD-10-CM | POA: Insufficient documentation

## 2014-07-05 DIAGNOSIS — S0993XA Unspecified injury of face, initial encounter: Secondary | ICD-10-CM

## 2014-07-05 DIAGNOSIS — S0011XA Contusion of right eyelid and periocular area, initial encounter: Secondary | ICD-10-CM | POA: Insufficient documentation

## 2014-07-05 DIAGNOSIS — S8001XA Contusion of right knee, initial encounter: Secondary | ICD-10-CM | POA: Insufficient documentation

## 2014-07-05 DIAGNOSIS — S0083XA Contusion of other part of head, initial encounter: Secondary | ICD-10-CM | POA: Insufficient documentation

## 2014-07-05 DIAGNOSIS — Z720 Tobacco use: Secondary | ICD-10-CM | POA: Insufficient documentation

## 2014-07-05 DIAGNOSIS — S8002XA Contusion of left knee, initial encounter: Secondary | ICD-10-CM | POA: Insufficient documentation

## 2014-07-05 DIAGNOSIS — S60221A Contusion of right hand, initial encounter: Secondary | ICD-10-CM | POA: Insufficient documentation

## 2014-07-05 DIAGNOSIS — Z880 Allergy status to penicillin: Secondary | ICD-10-CM | POA: Insufficient documentation

## 2014-07-05 DIAGNOSIS — S0990XA Unspecified injury of head, initial encounter: Secondary | ICD-10-CM | POA: Insufficient documentation

## 2014-07-05 DIAGNOSIS — Y998 Other external cause status: Secondary | ICD-10-CM | POA: Insufficient documentation

## 2014-07-05 DIAGNOSIS — W19XXXA Unspecified fall, initial encounter: Secondary | ICD-10-CM | POA: Insufficient documentation

## 2014-07-05 DIAGNOSIS — S1081XA Abrasion of other specified part of neck, initial encounter: Secondary | ICD-10-CM | POA: Insufficient documentation

## 2014-07-05 DIAGNOSIS — Y939 Activity, unspecified: Secondary | ICD-10-CM | POA: Insufficient documentation

## 2014-07-05 DIAGNOSIS — S0012XA Contusion of left eyelid and periocular area, initial encounter: Secondary | ICD-10-CM | POA: Insufficient documentation

## 2014-07-05 DIAGNOSIS — Z3202 Encounter for pregnancy test, result negative: Secondary | ICD-10-CM | POA: Insufficient documentation

## 2014-07-05 DIAGNOSIS — N390 Urinary tract infection, site not specified: Secondary | ICD-10-CM | POA: Insufficient documentation

## 2014-07-05 DIAGNOSIS — Y929 Unspecified place or not applicable: Secondary | ICD-10-CM | POA: Insufficient documentation

## 2014-07-05 DIAGNOSIS — Z862 Personal history of diseases of the blood and blood-forming organs and certain disorders involving the immune mechanism: Secondary | ICD-10-CM | POA: Insufficient documentation

## 2014-07-05 HISTORY — DX: Anemia, unspecified: D64.9
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
## 2014-07-06 ENCOUNTER — Other Ambulatory Visit (HOSPITAL_COMMUNITY): Payer: Self-pay

## 2014-07-06 ENCOUNTER — Emergency Department (HOSPITAL_COMMUNITY): Payer: Self-pay

## 2014-07-06 LAB — URINALYSIS, ROUTINE W REFLEX MICROSCOPIC
Bilirubin Urine: NEGATIVE
GLUCOSE, UA: NEGATIVE mg/dL
Hgb urine dipstick: NEGATIVE
Ketones, ur: 15 mg/dL — AB
Nitrite: NEGATIVE
PROTEIN: 100 mg/dL — AB
SPECIFIC GRAVITY, URINE: 1.029 (ref 1.005–1.030)
Urobilinogen, UA: 1 mg/dL (ref 0.0–1.0)
pH: 8.5 — ABNORMAL HIGH (ref 5.0–8.0)

## 2014-07-06 LAB — COMPREHENSIVE METABOLIC PANEL
ALBUMIN: 3.6 g/dL (ref 3.5–5.2)
ALT: 22 U/L (ref 0–35)
AST: 28 U/L (ref 0–37)
Alkaline Phosphatase: 63 U/L (ref 39–117)
Anion gap: 12 (ref 5–15)
BUN: 15 mg/dL (ref 6–23)
CO2: 23 mEq/L (ref 19–32)
CREATININE: 0.74 mg/dL (ref 0.50–1.10)
Calcium: 9.4 mg/dL (ref 8.4–10.5)
Chloride: 101 mEq/L (ref 96–112)
GFR calc Af Amer: >90 mL/min (ref >90)
GFR calc non Af Amer: >90 mL/min (ref >90)
Glucose, Bld: 84 mg/dL (ref 70–99)
Potassium: 4.2 mEq/L (ref 3.7–5.3)
Sodium: 136 mEq/L — ABNORMAL LOW (ref 137–147)
TOTAL PROTEIN: 6.5 g/dL (ref 6.0–8.3)
Total Bilirubin: 0.9 mg/dL (ref 0.3–1.2)

## 2014-07-06 LAB — CBC WITH DIFFERENTIAL/PLATELET
BASOS ABS: 0 10*3/uL (ref 0.0–0.1)
BASOS PCT: 0 % (ref 0–1)
Eosinophils Absolute: 0.3 10*3/uL (ref 0.0–0.7)
Eosinophils Relative: 2 % (ref 0–5)
HEMATOCRIT: 37 % (ref 36.0–46.0)
Hemoglobin: 12.5 g/dL (ref 12.0–15.0)
Lymphocytes Relative: 19 % (ref 12–46)
Lymphs Abs: 2.5 10*3/uL (ref 0.7–4.0)
MCH: 31.2 pg (ref 26.0–34.0)
MCHC: 33.8 g/dL (ref 30.0–36.0)
MCV: 92.3 fL (ref 78.0–100.0)
MONO ABS: 1.4 10*3/uL — AB (ref 0.1–1.0)
Monocytes Relative: 11 % (ref 3–12)
Neutro Abs: 9.1 10*3/uL — ABNORMAL HIGH (ref 1.7–7.7)
Neutrophils Relative %: 68 % (ref 43–77)
PLATELETS: 231 10*3/uL (ref 150–400)
RBC: 4.01 MIL/uL (ref 3.87–5.11)
RDW: 12.5 % (ref 11.5–15.5)
WBC: 13.4 10*3/uL — ABNORMAL HIGH (ref 4.0–10.5)

## 2014-07-06 LAB — PREGNANCY, URINE: PREG TEST UR: NEGATIVE

## 2014-07-06 LAB — URINE MICROSCOPIC-ADD ON

## 2014-07-06 MED ORDER — NITROFURANTOIN MONOHYD MACRO 100 MG PO CAPS: 100.0000 mg | ORAL_CAPSULE | Freq: Two times a day (BID) | ORAL | Status: DC

## 2014-07-06 NOTE — Discharge Instructions (Signed)
Concussion A concussion, or closed-head injury, is a brain injury caused by a direct blow to the head or by a quick and sudden movement (jolt) of the head or neck. Concussions are usually not life-threatening. Even so, the effects of a concussion can be serious. If you have had a concussion before, you are more likely to experience concussion-like symptoms after a direct blow to the head.  CAUSES  Direct blow to the head, such as from running into another player during a soccer game, being hit in a fight, or hitting your head on a hard surface.  A jolt of the head or neck that causes the brain to move back and forth inside the skull, such as in a car crash. SIGNS AND SYMPTOMS The signs of a concussion can be hard to notice. Early on, they may be missed by you, family members, and health care providers. You may look fine but act or feel differently. Symptoms are usually temporary, but they may last for days, weeks, or even longer. Some symptoms may appear right away while others may not show up for hours or days. Every head injury is different. Symptoms include:  Mild to moderate headaches that will not go away.  A feeling of pressure inside your head.  Having more trouble than usual:  Learning or remembering things you have heard.  Answering questions.  Paying attention or concentrating.  Organizing daily tasks.  Making decisions and solving problems.  Slowness in thinking, acting or reacting, speaking, or reading.  Getting lost or being easily confused.  Feeling tired all the time or lacking energy (fatigued).  Feeling drowsy.  Sleep disturbances.  Sleeping more than usual.  Sleeping less than usual.  Trouble falling asleep.  Trouble sleeping (insomnia).  Loss of balance or feeling lightheaded or dizzy.  Nausea or vomiting.  Numbness or tingling.  Increased sensitivity to:  Sounds.  Lights.  Distractions.  Vision problems or eyes that tire  easily.  Diminished sense of taste or smell.  Ringing in the ears.  Mood changes such as feeling sad or anxious.  Becoming easily irritated or angry for little or no reason.  Lack of motivation.  Seeing or hearing things other people do not see or hear (hallucinations). DIAGNOSIS Your health care provider can usually diagnose a concussion based on a description of your injury and symptoms. He or she will ask whether you passed out (lost consciousness) and whether you are having trouble remembering events that happened right before and during your injury. Your evaluation might include:  A brain scan to look for signs of injury to the brain. Even if the test shows no injury, you may still have a concussion.  Blood tests to be sure other problems are not present. TREATMENT  Concussions are usually treated in an emergency department, in urgent care, or at a clinic. You may need to stay in the hospital overnight for further treatment.  Tell your health care provider if you are taking any medicines, including prescription medicines, over-the-counter medicines, and natural remedies. Some medicines, such as blood thinners (anticoagulants) and aspirin, may increase the chance of complications. Also tell your health care provider whether you have had alcohol or are taking illegal drugs. This information may affect treatment.  Your health care provider will send you home with important instructions to follow.  How fast you will recover from a concussion depends on many factors. These factors include how severe your concussion is, what part of your brain was injured, your  age, and how healthy you were before the concussion. °· Most people with mild injuries recover fully. Recovery can take time. In general, recovery is slower in older persons. Also, persons who have had a concussion in the past or have other medical problems may find that it takes longer to recover from their current injury. °HOME  CARE INSTRUCTIONS °General Instructions °· Carefully follow the directions your health care provider gave you. °· Only take over-the-counter or prescription medicines for pain, discomfort, or fever as directed by your health care provider. °· Take only those medicines that your health care provider has approved. °· Do not drink alcohol until your health care provider says you are well enough to do so. Alcohol and certain other drugs may slow your recovery and can put you at risk of further injury. °· If it is harder than usual to remember things, write them down. °· If you are easily distracted, try to do one thing at a time. For example, do not try to watch TV while fixing dinner. °· Talk with family members or close friends when making important decisions. °· Keep all follow-up appointments. Repeated evaluation of your symptoms is recommended for your recovery. °· Watch your symptoms and tell others to do the same. Complications sometimes occur after a concussion. Older adults with a brain injury may have a higher risk of serious complications, such as a blood clot on the brain. °· Tell your teachers, school nurse, school counselor, coach, athletic trainer, or work manager about your injury, symptoms, and restrictions. Tell them about what you can or cannot do. They should watch for: °¨ Increased problems with attention or concentration. °¨ Increased difficulty remembering or learning new information. °¨ Increased time needed to complete tasks or assignments. °¨ Increased irritability or decreased ability to cope with stress. °¨ Increased symptoms. °· Rest. Rest helps the brain to heal. Make sure you: °¨ Get plenty of sleep at night. Avoid staying up late at night. °¨ Keep the same bedtime hours on weekends and weekdays. °¨ Rest during the day. Take daytime naps or rest breaks when you feel tired. °· Limit activities that require a lot of thought or concentration. These include: °¨ Doing homework or job-related  work. °¨ Watching TV. °¨ Working on the computer. °· Avoid any situation where there is potential for another head injury (football, hockey, soccer, basketball, martial arts, downhill snow sports and horseback riding). Your condition will get worse every time you experience a concussion. You should avoid these activities until you are evaluated by the appropriate follow-up health care providers. °Returning To Your Regular Activities °You will need to return to your normal activities slowly, not all at once. You must give your body and brain enough time for recovery. °· Do not return to sports or other athletic activities until your health care provider tells you it is safe to do so. °· Ask your health care provider when you can drive, ride a bicycle, or operate heavy machinery. Your ability to react may be slower after a brain injury. Never do these activities if you are dizzy. °· Ask your health care provider about when you can return to work or school. °Preventing Another Concussion °It is very important to avoid another brain injury, especially before you have recovered. In rare cases, another injury can lead to permanent brain damage, brain swelling, or death. The risk of this is greatest during the first 7-10 days after a head injury. Avoid injuries by: °· Wearing a seat   belt when riding in a car.  Drinking alcohol only in moderation.  Wearing a helmet when biking, skiing, skateboarding, skating, or doing similar activities.  Avoiding activities that could lead to a second concussion, such as contact or recreational sports, until your health care provider says it is okay.  Taking safety measures in your home.  Remove clutter and tripping hazards from floors and stairways.  Use grab bars in bathrooms and handrails by stairs.  Place non-slip mats on floors and in bathtubs.  Improve lighting in dim areas. SEEK MEDICAL CARE IF:  You have increased problems paying attention or  concentrating.  You have increased difficulty remembering or learning new information.  You need more time to complete tasks or assignments than before.  You have increased irritability or decreased ability to cope with stress.  You have more symptoms than before. Seek medical care if you have any of the following symptoms for more than 2 weeks after your injury:  Lasting (chronic) headaches.  Dizziness or balance problems.  Nausea.  Vision problems.  Increased sensitivity to noise or light.  Depression or mood swings.  Anxiety or irritability.  Memory problems.  Difficulty concentrating or paying attention.  Sleep problems.  Feeling tired all the time. SEEK IMMEDIATE MEDICAL CARE IF:  You have severe or worsening headaches. These may be a sign of a blood clot in the brain.  You have weakness (even if only in one hand, leg, or part of the face).  You have numbness.  You have decreased coordination.  You vomit repeatedly.  You have increased sleepiness.  One pupil is larger than the other.  You have convulsions.  You have slurred speech.  You have increased confusion. This may be a sign of a blood clot in the brain.  You have increased restlessness, agitation, or irritability.  You are unable to recognize people or places.  You have neck pain.  It is difficult to wake you up.  You have unusual behavior changes.  You lose consciousness. MAKE SURE YOU:  Understand these instructions.  Will watch your condition.  Will get help right away if you are not doing well or get worse. Document Released: 09/30/2003 Document Revised: 07/15/2013 Document Reviewed: 01/30/2013 [facility] Patient Information 2015 [place], [place]. This information is not intended to replace advice given to you by your health care provider. Make sure you discuss any questions you have with your health care provider.  Urinary Tract Infection Urinary tract infections (UTIs) can  develop anywhere along your urinary tract. Your urinary tract is your body's drainage system for removing wastes and extra water. Your urinary tract includes two kidneys, two ureters, a bladder, and a urethra. Your kidneys are a pair of bean-shaped organs. Each kidney is about the size of your fist. They are located below your ribs, one on each side of your spine. CAUSES Infections are caused by microbes, which are microscopic organisms, including fungi, viruses, and bacteria. These organisms are so small that they can only be seen through a microscope. Bacteria are the microbes that most commonly cause UTIs. SYMPTOMS  Symptoms of UTIs may vary by age and gender of the patient and by the location of the infection. Symptoms in young women typically include a frequent and intense urge to urinate and a painful, burning feeling in the bladder or urethra during urination. Older women and men are more likely to be tired, shaky, and weak and have muscle aches and abdominal pain. A fever may mean the infection  is in your kidneys. Other symptoms of a kidney infection include pain in your back or sides below the ribs, nausea, and vomiting. DIAGNOSIS To diagnose a UTI, your caregiver will ask you about your symptoms. Your caregiver also will ask to provide a urine sample. The urine sample will be tested for bacteria and white blood cells. White blood cells are made by your body to help fight infection. TREATMENT  Typically, UTIs can be treated with medication. Because most UTIs are caused by a bacterial infection, they usually can be treated with the use of antibiotics. The choice of antibiotic and length of treatment depend on your symptoms and the type of bacteria causing your infection. HOME CARE INSTRUCTIONS  If you were prescribed antibiotics, take them exactly as your caregiver instructs you. Finish the medication even if you feel better after you have only taken some of the medication.  Drink enough water  and fluids to keep your urine clear or pale yellow.  Avoid caffeine, tea, and carbonated beverages. They tend to irritate your bladder.  Empty your bladder often. Avoid holding urine for long periods of time.  Empty your bladder before and after sexual intercourse.  After a bowel movement, women should cleanse from front to back. Use each tissue only once. SEEK MEDICAL CARE IF:   You have back pain.  You develop a fever.  Your symptoms do not begin to resolve within 3 days. SEEK IMMEDIATE MEDICAL CARE IF:   You have severe back pain or lower abdominal pain.  You develop chills.  You have nausea or vomiting.  You have continued burning or discomfort with urination. MAKE SURE YOU:   Understand these instructions.  Will watch your condition.  Will get help right away if you are not doing well or get worse. Document Released: 04/19/2005 Document Revised: 01/09/2012 Document Reviewed: 08/18/2011 [facility] Patient Information 2015 [place], [place]. This information is not intended to replace advice given to you by your health care provider. Make sure you discuss any questions you have with your health care provider.

## 2015-12-18 ENCOUNTER — Encounter (HOSPITAL_COMMUNITY): Payer: Self-pay | Admitting: *Deleted

## 2015-12-18 ENCOUNTER — Emergency Department (HOSPITAL_COMMUNITY)
Admission: EM | Admit: 2015-12-18 | Discharge: 2015-12-18 | Disposition: A | Discharge status: Home / Self Care | Payer: Self-pay | Attending: Emergency Medicine | Admitting: Emergency Medicine

## 2015-12-18 DIAGNOSIS — R0602 Shortness of breath: Secondary | ICD-10-CM | POA: Insufficient documentation

## 2015-12-18 DIAGNOSIS — F17210 Nicotine dependence, cigarettes, uncomplicated: Secondary | ICD-10-CM | POA: Insufficient documentation

## 2015-12-18 MED ORDER — ALBUTEROL SULFATE (2.5 MG/3ML) 0.083% IN NEBU
5.0000 mg | INHALATION_SOLUTION | Freq: Once | RESPIRATORY_TRACT | Status: AC
  Administered 2015-12-18: 5 mg via RESPIRATORY_TRACT
  Filled 2015-12-18: qty 6

## 2015-12-18 NOTE — ED Notes (Signed)
pts name called x 2 for x-ray with no response.

## 2015-12-18 NOTE — ED Notes (Signed)
The pt is c/o  Difficulty breathing for the past 3-4 hours.  Her boyfriend has jus brought a dog home and as a child she had asthma.  Her nose is running and shehad had difficukty breathing since the animal appeared  lmp a[pril 26th

## 2015-12-18 NOTE — ED Notes (Signed)
Pts name called for room assignment - no answer. 

## 2016-03-09 ENCOUNTER — Encounter (HOSPITAL_COMMUNITY): Payer: Self-pay | Admitting: Emergency Medicine

## 2016-03-09 ENCOUNTER — Emergency Department (HOSPITAL_COMMUNITY)
Admission: EM | Admit: 2016-03-09 | Discharge: 2016-03-10 | Disposition: A | Discharge status: Home / Self Care | Payer: Self-pay | Attending: Emergency Medicine | Admitting: Emergency Medicine

## 2016-03-09 DIAGNOSIS — Z791 Long term (current) use of non-steroidal anti-inflammatories (NSAID): Secondary | ICD-10-CM | POA: Insufficient documentation

## 2016-03-09 DIAGNOSIS — H5712 Ocular pain, left eye: Secondary | ICD-10-CM | POA: Insufficient documentation

## 2016-03-09 DIAGNOSIS — R1013 Epigastric pain: Secondary | ICD-10-CM | POA: Insufficient documentation

## 2016-03-09 DIAGNOSIS — F17210 Nicotine dependence, cigarettes, uncomplicated: Secondary | ICD-10-CM | POA: Insufficient documentation

## 2016-03-09 DIAGNOSIS — R609 Edema, unspecified: Secondary | ICD-10-CM | POA: Insufficient documentation

## 2016-03-09 LAB — BASIC METABOLIC PANEL
ANION GAP: 6 (ref 5–15)
BUN: 17 mg/dL (ref 6–20)
CHLORIDE: 108 mmol/L (ref 101–111)
CO2: 25 mmol/L (ref 22–32)
Calcium: 9.3 mg/dL (ref 8.9–10.3)
Creatinine, Ser: 0.65 mg/dL (ref 0.44–1.00)
GFR calc non Af Amer: >60 mL/min (ref >60)
Glucose, Bld: 91 mg/dL (ref 65–99)
Potassium: 3.8 mmol/L (ref 3.5–5.1)
SODIUM: 139 mmol/L (ref 135–145)

## 2016-03-09 LAB — CBC
HEMATOCRIT: 38.6 % (ref 36.0–46.0)
HEMOGLOBIN: 12.6 g/dL (ref 12.0–15.0)
MCH: 31 pg (ref 26.0–34.0)
MCHC: 32.6 g/dL (ref 30.0–36.0)
MCV: 94.8 fL (ref 78.0–100.0)
Platelets: 246 10*3/uL (ref 150–400)
RBC: 4.07 MIL/uL (ref 3.87–5.11)
RDW: 12.8 % (ref 11.5–15.5)
WBC: 10.3 10*3/uL (ref 4.0–10.5)

## 2016-03-09 NOTE — ED Triage Notes (Signed)
Pt states for the past 3 days she has been having swelling in her lower extremities  Pt states her left is worse than her right  Pt states that her left leg feels tingly like it is asleep and she has lots of pressure in her feet  Pt states her left foot has been itchy and she has been scratching it and now she has a place on the top of it that has been draining yellow pus  PT states she her left eye has been bothering her and her left arm feels a little tingly too  Pt is negative for any neuro deficits   Pt also is c/o rash to both of her sides

## 2016-03-10 MED ORDER — HYDROCHLOROTHIAZIDE 25 MG PO TABS: 25.0000 mg | ORAL_TABLET | Freq: Every day | ORAL | 0 refills | Status: AC

## 2016-03-10 NOTE — Discharge Instructions (Signed)
Take ranitidine (Zantac), famotidine (Pepcid AC), or omeprazole (Prilosec OTC) as needed for your heartburn.  Stay on a low-salt diet.

## 2016-03-10 NOTE — ED Provider Notes (Signed)
[facility]-EMERGENCY DEPT Provider Note   CSN: [id number] Arrival date & time: 03/09/16  1944  By signing my name below, I, Jasmyn B. Alexander, attest that this documentation has been prepared under the direction and in the presence of Dione Boozeavid Cristian Davitt, MD. Electronically Signed: Gillis EndsJasmyn B. Lyn HollingsheadAlexander, ED Scribe. 03/10/16. 12:20 AM.  History   Chief Complaint Chief Complaint  Patient presents with  . Leg Swelling    HPI HPI Comments: Cynthia Mcclure is a 35 y.o. female who presents to the Emergency Department complaining of gradual onset, constant, bilateral leg swelling x 3 days. Pt has associated intermittent tingling of all extremities. She states that both her feet are painful when bearing weight. She also complains of left eye pain but denies any visual disturbance. Denies any shortness of breath, chest pain, dizziness, weakness, numbness or lightheadedness. Pt is a current smoker (5 cigarettes/day) and occasional drinker. [facility]   The history is provided by the patient. No language interpreter was used.   Past Medical History:  Diagnosis Date  . Anemia     Patient Active Problem List   Diagnosis Date Noted  . OBESITY, NOS 09/20/2006  . TOBACCO DEPENDENCE 09/20/2006  . DEPRESSIVE DISORDER, NOS 09/20/2006  . RHINITIS, ALLERGIC 09/20/2006    Past Surgical History:  Procedure Laterality Date  . TUBAL LIGATION      OB History    No data available     Home Medications    Prior to Admission medications   Medication Sig Start Date End Date Taking? Authorizing Provider  ibuprofen (ADVIL,MOTRIN) 800 MG tablet Take 800 mg by mouth every 8 (eight) hours as needed. For pain   Yes Historical Provider, MD  nitrofurantoin, macrocrystal-monohydrate, (MACROBID) 100 MG capsule Take 1 capsule (100 mg total) by mouth 2 (two) times daily. Patient not taking: Reported on 03/09/2016 07/06/14   Loren Raceravid Yelverton, MD  tobramycin (TOBREX) 0.3 % ophthalmic  solution Place 1 drop into the left eye every 4 (four) hours. Patient not taking: Reported on 07/06/2014 05/29/12   Kathrynn Speedobyn M Hess, PA-C    Family History Family History  Problem Relation Age of Onset  . Diabetes Other   . Hypertension Other   . Lupus Other     Social History Social History  Substance Use Topics  . Smoking status: Current Every Day Smoker    Packs/day: 0.50    Types: Cigarettes  . Smokeless tobacco: Never Used  . Alcohol use Yes     Comment: socially    Allergies   Penicillins  Review of Systems Review of Systems  Eyes: Positive for pain. Negative for visual disturbance.  Respiratory: Negative for shortness of breath.   Cardiovascular: Positive for leg swelling. Negative for chest pain.  Neurological: Negative for dizziness, weakness, light-headedness and numbness.  All other systems reviewed and are negative.   Physical Exam Updated Vital Signs BP 119/75   Pulse (!) 58   Temp 98.4 F (36.9 C) (Oral)   Resp 16   Ht 5\' 1"  (1.549 m)   Wt 250 lb (113.4 kg)   LMP 02/24/2016 (Exact Date)   SpO2 97%   BMI 47.24 kg/m   Physical Exam  Constitutional: She is oriented to person, place, and time. She appears well-developed and well-nourished.  HENT:  Head: Normocephalic and atraumatic.  Eyes: EOM are normal. Pupils are equal, round, and reactive to light.  Neck: Normal range of motion. Neck supple. No JVD present.  Cardiovascular: Normal  rate, regular rhythm and normal heart sounds.   No murmur heard. Pulmonary/Chest: Effort normal and breath sounds normal. She has no wheezes. She has no rales. She exhibits no tenderness.  Abdominal: Soft. Bowel sounds are normal. She exhibits no distension and no mass. There is no tenderness.  Musculoskeletal: Normal range of motion. She exhibits edema.  1+ pre-tibial and pedal edema  Lymphadenopathy:    She has no cervical adenopathy.  Neurological: She is alert and oriented to person, place, and time. No cranial  nerve deficit. She exhibits normal muscle tone. Coordination normal.  Skin: Skin is warm and dry. No rash noted.  Psychiatric: She has a normal mood and affect. Her behavior is normal. Judgment and thought content normal.  Nursing note and vitals reviewed.  ED Treatments / Results  DIAGNOSTIC STUDIES: Oxygen Saturation is 97% on RA, normal by my interpretation.    COORDINATION OF CARE: 12:18 AM-Discussed treatment plan which includes order of Hctz and avoiding high sodium foods with pt at bedside and pt agreed to plan.   Labs (all labs ordered are listed, but only abnormal results are displayed) Labs Reviewed  BASIC METABOLIC PANEL  CBC   Procedures Procedures (including critical care time)  Medications Ordered in ED Medications - No data to display  Initial Impression / Assessment and Plan / ED Course  I have reviewed the triage vital signs and the nursing notes.  Pertinent labs that were available during my care of the patient were reviewed by me and considered in my medical decision making (see chart for details).  Clinical Course    Peripheral edema. There are no other signs of CHF. Swelling is symmetric so doubt DVT. Discussion with patient confirms excess sodium consumption in her diet. Discussed option of checking hepatic function panel of while she is here, the patient is concerned that she has already been here an excessive amount of time and does not wish to wait longer. Will treat empirically with HCTZ for 2 weeks. Advised to stay on a low-salt diet. Other complaint of dyspepsia will be treated symptomatically with over-the-counter H2 blockers or proton pump inhibitors.  Final Clinical Impressions(s) / ED Diagnoses   Final diagnoses:  Peripheral edema  Dyspepsia    New Prescriptions New Prescriptions   HYDROCHLOROTHIAZIDE (HYDRODIURIL) 25 MG TABLET    Take 1 tablet (25 mg total) by mouth daily.   I personally performed the services described in this  documentation, which was scribed in my presence. The recorded information has been reviewed and is accurate.       Dione Boozeavid Rollie Hynek, MD 03/10/16 [phone]

## 2016-08-06 ENCOUNTER — Emergency Department (HOSPITAL_BASED_OUTPATIENT_CLINIC_OR_DEPARTMENT_OTHER): Payer: Medicaid Other

## 2016-08-06 ENCOUNTER — Emergency Department (HOSPITAL_COMMUNITY)
Admission: EM | Admit: 2016-08-06 | Discharge: 2016-08-06 | Disposition: A | Discharge status: Home / Self Care | Payer: Medicaid Other | Attending: Emergency Medicine | Admitting: Emergency Medicine

## 2016-08-06 ENCOUNTER — Emergency Department (HOSPITAL_COMMUNITY): Payer: Medicaid Other

## 2016-08-06 ENCOUNTER — Encounter (HOSPITAL_COMMUNITY): Payer: Self-pay | Admitting: *Deleted

## 2016-08-06 DIAGNOSIS — M7989 Other specified soft tissue disorders: Secondary | ICD-10-CM

## 2016-08-06 DIAGNOSIS — Z79899 Other long term (current) drug therapy: Secondary | ICD-10-CM | POA: Insufficient documentation

## 2016-08-06 DIAGNOSIS — R609 Edema, unspecified: Secondary | ICD-10-CM

## 2016-08-06 DIAGNOSIS — M79609 Pain in unspecified limb: Secondary | ICD-10-CM

## 2016-08-06 DIAGNOSIS — R600 Localized edema: Secondary | ICD-10-CM | POA: Insufficient documentation

## 2016-08-06 DIAGNOSIS — F17210 Nicotine dependence, cigarettes, uncomplicated: Secondary | ICD-10-CM | POA: Insufficient documentation

## 2016-08-06 LAB — CBC WITH DIFFERENTIAL/PLATELET
Basophils Absolute: 0 10*3/uL (ref 0.0–0.1)
Basophils Relative: 0 %
Eosinophils Absolute: 0.5 10*3/uL (ref 0.0–0.7)
Eosinophils Relative: 4 %
HCT: 34.7 % — ABNORMAL LOW (ref 36.0–46.0)
Hemoglobin: 12 g/dL (ref 12.0–15.0)
Lymphocytes Relative: 28 %
Lymphs Abs: 3.5 10*3/uL (ref 0.7–4.0)
MCH: 31.3 pg (ref 26.0–34.0)
MCHC: 34.6 g/dL (ref 30.0–36.0)
MCV: 90.4 fL (ref 78.0–100.0)
Monocytes Absolute: 0.9 10*3/uL (ref 0.1–1.0)
Monocytes Relative: 7 %
Neutro Abs: 7.5 10*3/uL (ref 1.7–7.7)
Neutrophils Relative %: 61 %
Platelets: 280 10*3/uL (ref 150–400)
RBC: 3.84 MIL/uL — ABNORMAL LOW (ref 3.87–5.11)
RDW: 12.6 % (ref 11.5–15.5)
WBC: 12.4 10*3/uL — ABNORMAL HIGH (ref 4.0–10.5)

## 2016-08-06 LAB — URINALYSIS, ROUTINE W REFLEX MICROSCOPIC
Bilirubin Urine: NEGATIVE
Glucose, UA: NEGATIVE mg/dL
Hgb urine dipstick: NEGATIVE
Ketones, ur: NEGATIVE mg/dL
Leukocytes, UA: NEGATIVE
Nitrite: NEGATIVE
Protein, ur: NEGATIVE mg/dL
Specific Gravity, Urine: 1.02 (ref 1.005–1.030)
pH: 7 (ref 5.0–8.0)

## 2016-08-06 LAB — BASIC METABOLIC PANEL
Anion gap: 6 (ref 5–15)
BUN: 16 mg/dL (ref 6–20)
CO2: 25 mmol/L (ref 22–32)
Calcium: 8.6 mg/dL — ABNORMAL LOW (ref 8.9–10.3)
Chloride: 107 mmol/L (ref 101–111)
Creatinine, Ser: 0.73 mg/dL (ref 0.44–1.00)
GFR calc Af Amer: >60 mL/min (ref >60)
GFR calc non Af Amer: >60 mL/min (ref >60)
Glucose, Bld: 93 mg/dL (ref 65–99)
Potassium: 3.9 mmol/L (ref 3.5–5.1)
Sodium: 138 mmol/L (ref 135–145)

## 2016-08-06 LAB — PREGNANCY, URINE: Preg Test, Ur: NEGATIVE

## 2016-08-06 NOTE — Discharge Instructions (Signed)
Your testing here today was normal. Follow up with the clinic provided.

## 2016-08-06 NOTE — ED Notes (Signed)
Delay in blood draw pt isn't in room

## 2016-08-06 NOTE — ED Triage Notes (Signed)
Patient reports 4 days of bilateral feet, ankle and leg swelling. C/o pain in the left extremity. Reports symptoms are better, but still occurring. Denies shortness of breath.

## 2016-08-06 NOTE — Progress Notes (Addendum)
VASCULAR LAB PRELIMINARY  PRELIMINARY  PRELIMINARY  PRELIMINARY  Left lower extremity venous duplex completed.    Preliminary report:  There is no DVT or SVT noted in the left lower extremity.  Gave report to Charlestine Nighthristopher Lawyer, PA   Indica Marcott, RVT 08/06/2016, 10:05 AM

## 2016-08-06 NOTE — ED Notes (Signed)
No respiratory or acute distress noted alert and oriented x 3 call light in reach. 

## 2016-08-06 NOTE — ED Notes (Signed)
Vascular arrived at bedside.

## 2016-08-06 NOTE — ED Notes (Signed)
Patient transported to X-ray 

## 2016-08-07 NOTE — ED Provider Notes (Signed)
[facility]-EMERGENCY DEPT Provider Note   CSN: [id number] Arrival date & time: 08/06/16  0516     History   Chief Complaint Chief Complaint  Patient presents with  . Leg Swelling    HPI Cynthia Mcclure is a 36 y.o. female.  HPI Patient presents to the emergency department with intermittent lower extremity swelling.  Patient states over the last 3 days.  Her swelling is Worse mainly worse in the left leg with some calf tenderness.  The patient states that she is concerned about the swelling.  She states that his happened in the past.  She states she does stand for long periods of time at work.  Patient states she did not take any medications prior to arrival for her symptoms. The patient denies chest pain, shortness of breath, headache,blurred vision, neck pain, fever, cough, weakness, numbness, dizziness, anorexia,abdominal pain, nausea, vomiting, diarrhea, rash, back pain, dysuria, hematemesis, bloody stool, near syncope, or syncope. Past Medical History:  Diagnosis Date  . Anemia     Patient Active Problem List   Diagnosis Date Noted  . OBESITY, NOS 09/20/2006  . TOBACCO DEPENDENCE 09/20/2006  . DEPRESSIVE DISORDER, NOS 09/20/2006  . RHINITIS, ALLERGIC 09/20/2006    Past Surgical History:  Procedure Laterality Date  . TUBAL LIGATION      OB History    No data available       Home Medications    Prior to Admission medications   Medication Sig Start Date End Date Taking? Authorizing Provider  clindamycin (CLEOCIN) 300 MG capsule Take 300 mg by mouth 3 (three) times daily.   Yes Historical Provider, MD  ibuprofen (ADVIL,MOTRIN) 800 MG tablet Take 800 mg by mouth every 8 (eight) hours as needed. For pain   Yes Historical Provider, MD  hydrochlorothiazide (HYDRODIURIL) 25 MG tablet Take 1 tablet (25 mg total) by mouth daily. Patient not taking: Reported on 08/06/2016 03/10/16   Dione Boozeavid Glick, MD    Family History Family History  Problem Relation Age of Onset  .  Diabetes Other   . Hypertension Other   . Lupus Other     Social History Social History  Substance Use Topics  . Smoking status: Current Every Day Smoker    Packs/day: 0.50    Types: Cigarettes  . Smokeless tobacco: Never Used  . Alcohol use Yes     Comment: socially     Allergies   Penicillins   Review of Systems Review of Systems All other systems negative except as documented in the HPI. All pertinent positives and negatives as reviewed in the HPI.  Physical Exam Updated Vital Signs BP 138/83 (BP Location: Right Arm)   Pulse (!) 50   Temp 98 F (36.7 C) (Oral)   Resp 14   Ht 5\' 1"  (1.549 m)   Wt 113.4 kg   LMP 07/29/2016   SpO2 100%   BMI 47.24 kg/m   Physical Exam  Constitutional: She is oriented to person, place, and time. She appears well-developed and well-nourished. No distress.  HENT:  Head: Normocephalic and atraumatic.  Mouth/Throat: Oropharynx is clear and moist.  Eyes: Pupils are equal, round, and reactive to light.  Neck: Normal range of motion. Neck supple.  Cardiovascular: Normal rate, regular rhythm and normal heart sounds.  Exam reveals no gallop and no friction rub.   No murmur heard. Pulmonary/Chest: Effort normal and breath sounds normal. No respiratory distress. She has no wheezes.  Abdominal: Soft. Bowel sounds are normal. She exhibits no distension. There  is no tenderness.  Musculoskeletal: She exhibits edema.  Neurological: She is alert and oriented to person, place, and time. She exhibits normal muscle tone. Coordination normal.  Skin: Skin is warm and dry. No rash noted. No erythema.  Psychiatric: She has a normal mood and affect. Her behavior is normal.  Nursing note and vitals reviewed.    ED Treatments / Results  Labs (all labs ordered are listed, but only abnormal results are displayed) Labs Reviewed  BASIC METABOLIC PANEL - Abnormal; Notable for the following:       Result Value   Calcium 8.6 (*)    All other components  within normal limits  CBC WITH DIFFERENTIAL/PLATELET - Abnormal; Notable for the following:    WBC 12.4 (*)    RBC 3.84 (*)    HCT 34.7 (*)    All other components within normal limits  URINALYSIS, ROUTINE W REFLEX MICROSCOPIC - Abnormal; Notable for the following:    APPearance HAZY (*)    All other components within normal limits  PREGNANCY, URINE    EKG  EKG Interpretation  Date/Time:  Sunday August 06 2016 08:52:28 EST Ventricular Rate:  54 PR Interval:    QRS Duration: 93 QT Interval:  413 QTC Calculation: 392 R Axis:   7 Text Interpretation:  Sinus rhythm Low voltage, precordial leads Borderline T abnormalities, inferior leads Baseline wander in lead(s) V2 no prior EKG  Confirmed by GOLDSTON MD, SCOTT [phone]) on 08/07/2016 3:09:23 PM       Radiology Dg Chest 2 View  Result Date: 08/06/2016 CLINICAL DATA:  Bilateral lower leg swelling for the past 4 days. Left leg pain. Smoker. EXAM: CHEST  2 VIEW COMPARISON:  None. FINDINGS: Normal sized heart. Clear lungs. Mild diffuse peribronchial thickening. Mild scoliosis. IMPRESSION: Mild chronic bronchitic changes.  No acute abnormality. Electronically Signed   By: Beckie Salts M.D.   On: 08/06/2016 09:16    Procedures Procedures (including critical care time)  Medications Ordered in ED Medications - No data to display   Initial Impression / Assessment and Plan / ED Course  I have reviewed the triage vital signs and the nursing notes.  Pertinent labs & imaging results that were available during my care of the patient were reviewed by me and considered in my medical decision making (see chart for details).  Clinical Course     She will be referred to primary care.  Told to return here as needed.  The patient does not have any signs of DVT and no signs of heart failure, kidney disease, I feel that the patient's swelling is due to her standing for long periods of time and her weight  Final Clinical Impressions(s) / ED  Diagnoses   Final diagnoses:  Peripheral edema    New Prescriptions Discharge Medication List as of 08/06/2016 10:44 AM       Charlestine Night, PA-C 08/07/16 1651    Rolland Porter, MD 08/15/16 2046

## 2017-12-14 IMAGING — CR DG CHEST 2V
2 series · 2 of 2 positions shown · non-contrast
Comparison: None.

CLINICAL DATA: Bilateral lower leg swelling for the past 4 days.
Left leg pain. Smoker.

EXAM:
CHEST  2 VIEW

[w chest pa]
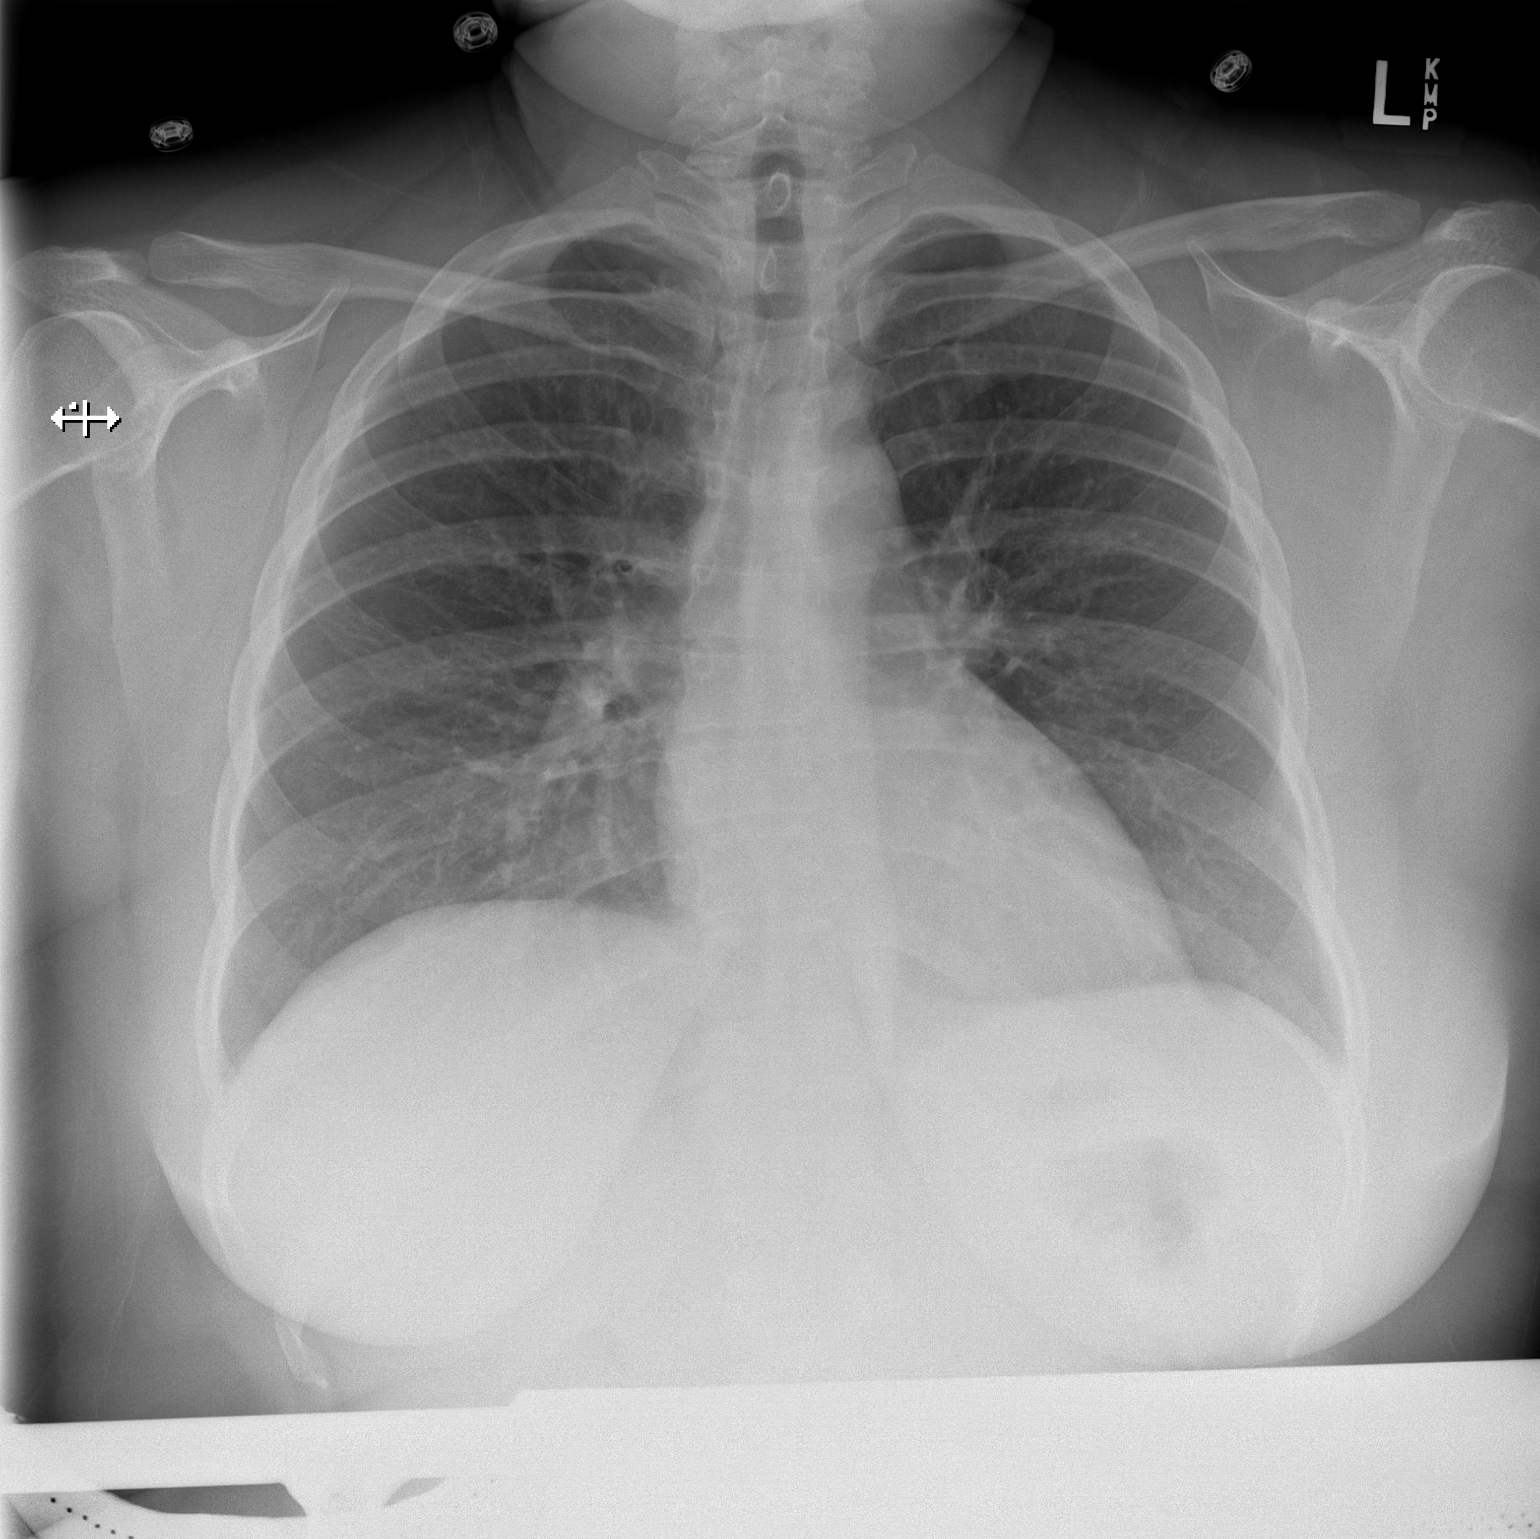

[w chest lat]
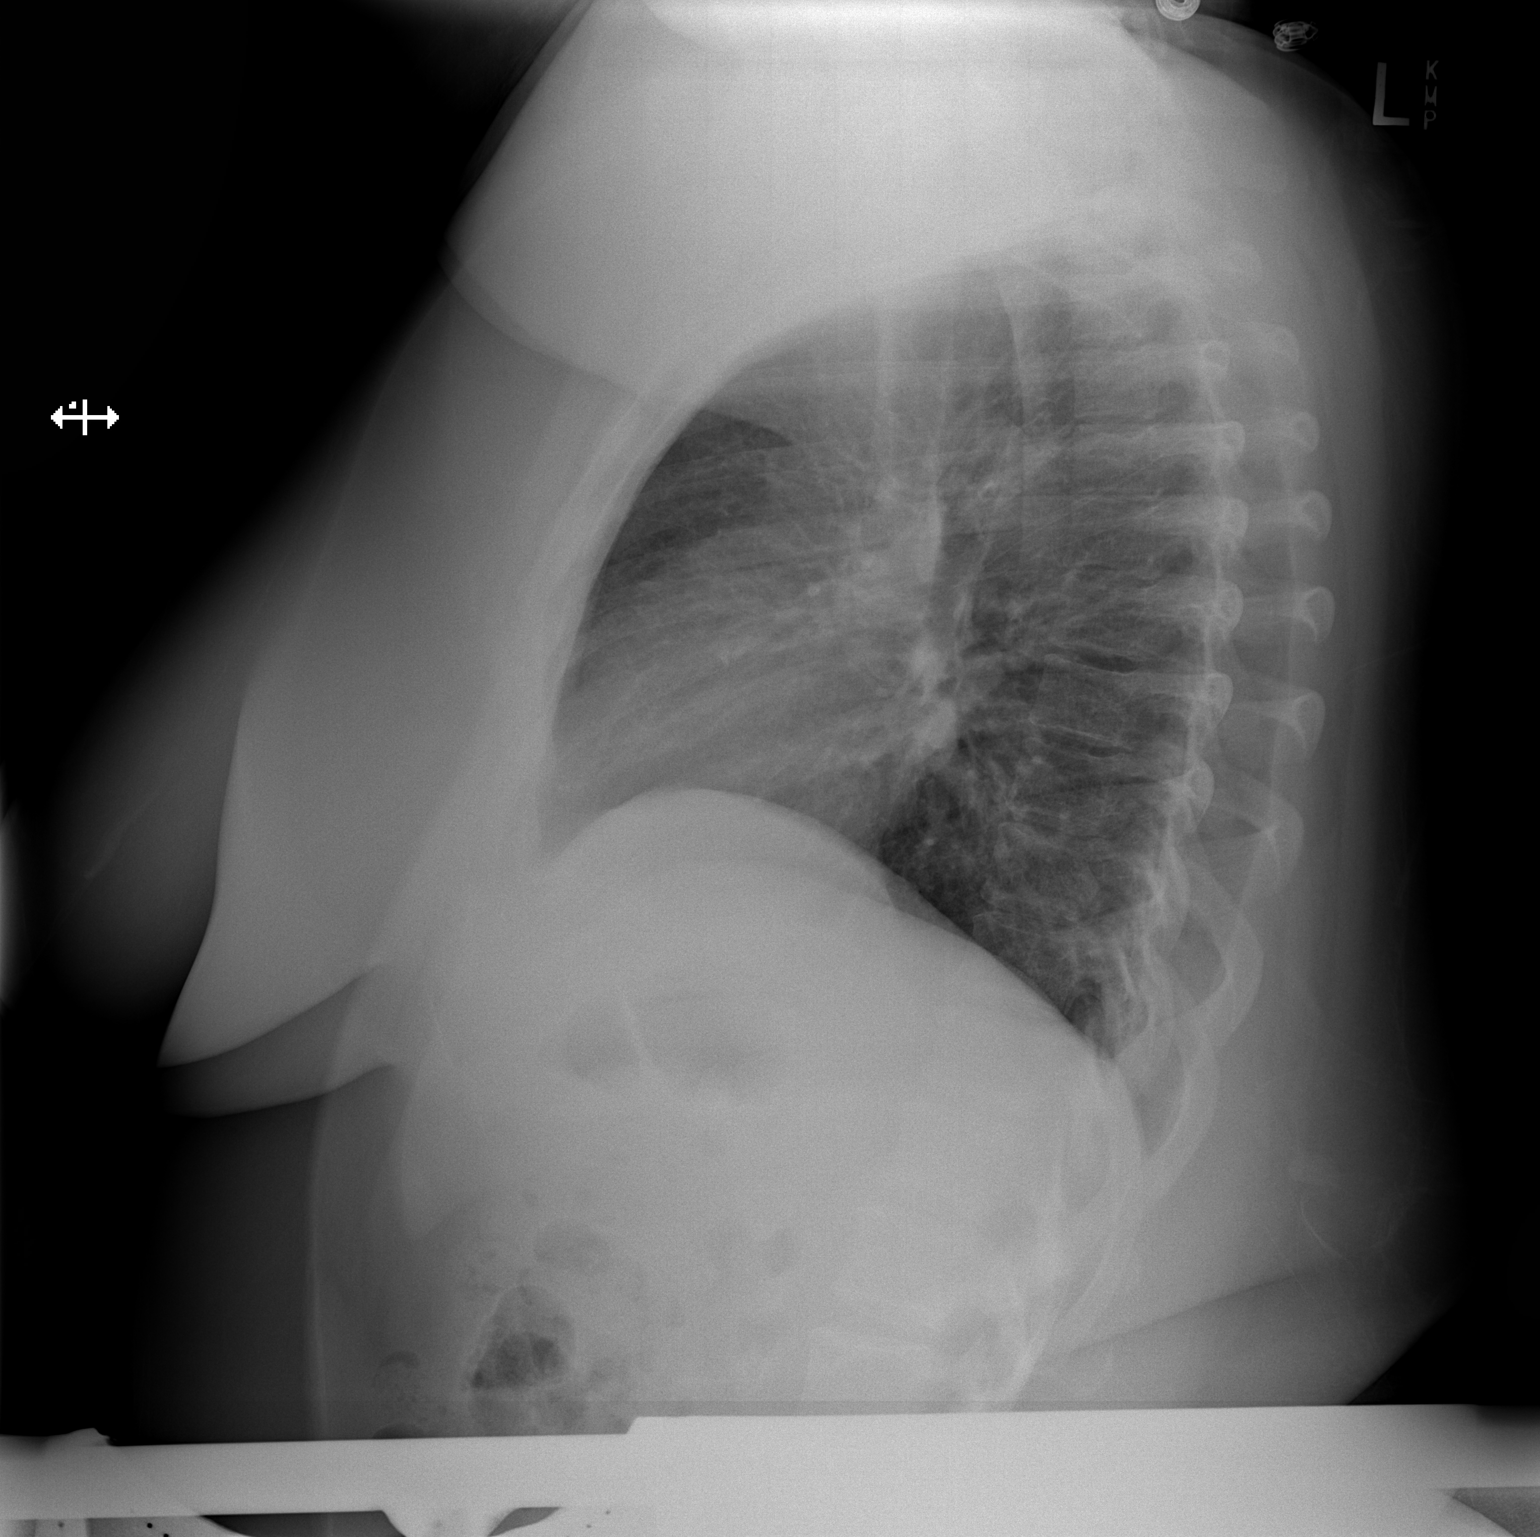

[2 of 2 positions shown; findings below may reference images not displayed]

FINDINGS: Normal sized heart. Clear lungs. Mild diffuse peribronchial
thickening. Mild scoliosis.
IMPRESSION: Mild chronic bronchitic changes.  No acute abnormality.

## 2018-12-07 ENCOUNTER — Encounter (HOSPITAL_COMMUNITY): Payer: Self-pay | Admitting: Emergency Medicine

## 2018-12-07 ENCOUNTER — Emergency Department (HOSPITAL_COMMUNITY): Payer: Self-pay

## 2018-12-07 ENCOUNTER — Emergency Department (HOSPITAL_COMMUNITY)
Admission: EM | Admit: 2018-12-07 | Discharge: 2018-12-07 | Disposition: A | Discharge status: Home / Self Care | Payer: Self-pay | Attending: Emergency Medicine | Admitting: Emergency Medicine

## 2018-12-07 ENCOUNTER — Other Ambulatory Visit: Payer: Self-pay

## 2018-12-07 DIAGNOSIS — R519 Headache, unspecified: Secondary | ICD-10-CM

## 2018-12-07 DIAGNOSIS — Z79899 Other long term (current) drug therapy: Secondary | ICD-10-CM | POA: Insufficient documentation

## 2018-12-07 DIAGNOSIS — R51 Headache: Secondary | ICD-10-CM | POA: Insufficient documentation

## 2018-12-07 DIAGNOSIS — F17210 Nicotine dependence, cigarettes, uncomplicated: Secondary | ICD-10-CM | POA: Insufficient documentation

## 2018-12-07 DIAGNOSIS — R0789 Other chest pain: Secondary | ICD-10-CM | POA: Insufficient documentation

## 2018-12-07 DIAGNOSIS — R202 Paresthesia of skin: Secondary | ICD-10-CM | POA: Insufficient documentation

## 2018-12-07 DIAGNOSIS — R42 Dizziness and giddiness: Secondary | ICD-10-CM | POA: Insufficient documentation

## 2018-12-07 LAB — URINALYSIS, ROUTINE W REFLEX MICROSCOPIC
Bilirubin Urine: NEGATIVE
Glucose, UA: NEGATIVE mg/dL
Hgb urine dipstick: NEGATIVE
Ketones, ur: NEGATIVE mg/dL
Nitrite: NEGATIVE
Protein, ur: NEGATIVE mg/dL
Specific Gravity, Urine: 1.026 (ref 1.005–1.030)
pH: 5 (ref 5.0–8.0)

## 2018-12-07 LAB — COMPREHENSIVE METABOLIC PANEL
ALT: 20 U/L (ref 0–44)
AST: 19 U/L (ref 15–41)
Albumin: 3.9 g/dL (ref 3.5–5.0)
Alkaline Phosphatase: 95 U/L (ref 38–126)
Anion gap: 7 (ref 5–15)
BUN: 14 mg/dL (ref 6–20)
CO2: 24 mmol/L (ref 22–32)
Calcium: 9.6 mg/dL (ref 8.9–10.3)
Chloride: 107 mmol/L (ref 98–111)
Creatinine, Ser: 0.79 mg/dL (ref 0.44–1.00)
GFR calc Af Amer: >60 mL/min (ref >60)
GFR calc non Af Amer: >60 mL/min (ref >60)
Glucose, Bld: 90 mg/dL (ref 70–99)
Potassium: 4.1 mmol/L (ref 3.5–5.1)
Sodium: 138 mmol/L (ref 135–145)
Total Bilirubin: 0.6 mg/dL (ref 0.3–1.2)
Total Protein: 6.5 g/dL (ref 6.5–8.1)

## 2018-12-07 LAB — CBC WITH DIFFERENTIAL/PLATELET
Abs Immature Granulocytes: 0.04 10*3/uL (ref 0.00–0.07)
Basophils Absolute: 0 10*3/uL (ref 0.0–0.1)
Basophils Relative: 0 %
Eosinophils Absolute: 0.3 10*3/uL (ref 0.0–0.5)
Eosinophils Relative: 3 %
HCT: 40.5 % (ref 36.0–46.0)
Hemoglobin: 13 g/dL (ref 12.0–15.0)
Immature Granulocytes: 0 %
Lymphocytes Relative: 25 %
Lymphs Abs: 3 10*3/uL (ref 0.7–4.0)
MCH: 30.4 pg (ref 26.0–34.0)
MCHC: 32.1 g/dL (ref 30.0–36.0)
MCV: 94.6 fL (ref 80.0–100.0)
Monocytes Absolute: 0.8 10*3/uL (ref 0.1–1.0)
Monocytes Relative: 7 %
Neutro Abs: 7.8 10*3/uL — ABNORMAL HIGH (ref 1.7–7.7)
Neutrophils Relative %: 65 %
Platelets: 242 10*3/uL (ref 150–400)
RBC: 4.28 MIL/uL (ref 3.87–5.11)
RDW: 12.5 % (ref 11.5–15.5)
WBC: 12 10*3/uL — ABNORMAL HIGH (ref 4.0–10.5)
nRBC: 0 % (ref 0.0–0.2)

## 2018-12-07 LAB — HCG, QUANTITATIVE, PREGNANCY: hCG, Beta Chain, Quant, S: <1 m[IU]/mL (ref <5)

## 2018-12-07 LAB — BRAIN NATRIURETIC PEPTIDE: B Natriuretic Peptide: 43.5 pg/mL (ref 0.0–100.0)

## 2018-12-07 LAB — TROPONIN I: Troponin I: <0.03 ng/mL (ref <0.03)

## 2018-12-07 MED ORDER — KETOROLAC TROMETHAMINE 15 MG/ML IJ SOLN
15.0000 mg | Freq: Once | INTRAMUSCULAR | Status: AC
  Administered 2018-12-07: 21:00:00 15 mg via INTRAMUSCULAR
  Filled 2018-12-07: qty 1

## 2018-12-07 MED ORDER — DIPHENHYDRAMINE HCL 25 MG PO CAPS
25.0000 mg | ORAL_CAPSULE | Freq: Once | ORAL | Status: AC
  Administered 2018-12-07: 25 mg via ORAL
  Filled 2018-12-07: qty 1

## 2018-12-07 MED ORDER — METOCLOPRAMIDE HCL 10 MG PO TABS
10.0000 mg | ORAL_TABLET | Freq: Once | ORAL | Status: AC
  Administered 2018-12-07: 10 mg via ORAL
  Filled 2018-12-07: qty 1

## 2018-12-07 NOTE — ED Notes (Signed)
Discharge instrucions discussed with Pt. Pt verbalized understanding. Pt stable and ambulatory.

## 2018-12-07 NOTE — ED Notes (Signed)
Pt updating family on her own.

## 2018-12-07 NOTE — ED Provider Notes (Signed)
[facility] Provider Note   CSN: [id number] Arrival date & time: 12/07/18  1853    History   Chief Complaint Chief Complaint  Patient presents with   Chest Pain   Dizziness   Headache    HPI Cynthia Mcclure is a [age] y.o. female.  HPI: A 38 year old patient with a history of obesity presents for evaluation of chest pain. Initial onset of pain was more than 6 hours ago. The patient's chest pain is described as heaviness/pressure/tightness and is not worse with exertion. The patient's chest pain is middle- or left-sided, is not well-localized, is not sharp and does not radiate to the arms/jaw/neck. The patient does not complain of nausea and denies diaphoresis. The patient has no history of stroke, has no history of peripheral artery disease, has not smoked in the past 90 days, denies any history of treated diabetes, has no relevant family history of coronary artery disease (first degree relative at less than age 37), is not hypertensive and has no history of hypercholesterolemia.   38 y.o female with a PMH of Anemia presents to the ED with chief complaint of chest pain, headache along with left arm numbness. Patient describes a left sided chest which she describes as burning sensation. She reports this pain began in the morning while she was at work, states she's been under a lot of stress lately, she is currently working as a Production designer, theatre/television/filmmanager at [employer]. She reports taking tylenol which helped with her symptoms.  Patient reports having similar episodes in the past, attributed them to stress.  Patient also reports a headache which feels like a migraine, usually when this happens she gets blurry vision, nausea and vomits.  She reports she has not been diagnosed with migraines but self diagnosed herself but she suffers from previous migraines, does not take any prophylactic medication aside from Tylenol.  She also reports her left arm numbness was felt this  morning while at work, reports has been under a lot of stress.  She denies any shortness of breath, fever, previous history of blood clots, cardiac history.  Does report her mother suffers from CHF.     Past Medical History:  Diagnosis Date   Anemia     Patient Active Problem List   Diagnosis Date Noted   OBESITY, NOS 09/20/2006   TOBACCO DEPENDENCE 09/20/2006   DEPRESSIVE DISORDER, NOS 09/20/2006   RHINITIS, ALLERGIC 09/20/2006    Past Surgical History:  Procedure Laterality Date   TUBAL LIGATION       OB History   No obstetric history on file.      Home Medications    Prior to Admission medications   Medication Sig Start Date End Date Taking? Authorizing Provider  clindamycin (CLEOCIN) 300 MG capsule Take 300 mg by mouth 3 (three) times daily.    [provider]  hydrochlorothiazide (HYDRODIURIL) 25 MG tablet Take 1 tablet (25 mg total) by mouth daily. Patient not taking: Reported on 08/06/2016 03/10/16   Dione BoozeGlick, David, MD  ibuprofen (ADVIL,MOTRIN) 800 MG tablet Take 800 mg by mouth every 8 (eight) hours as needed. For pain    [provider]    Family History Family History  Problem Relation Age of Onset   Diabetes Other    Hypertension Other    Lupus Other     Social History Social History   Tobacco Use   Smoking status: Current Every Day Smoker    Packs/day: 0.25    Types: Cigarettes  Smokeless tobacco: Never Used  Substance Use Topics   Alcohol use: Yes    Comment: socially   Drug use: No     Allergies   Penicillins   Review of Systems Review of Systems  Constitutional: Negative for chills and fever.  HENT: Negative for ear pain and sore throat.   Eyes: Negative for pain and visual disturbance.  Respiratory: Negative for cough and shortness of breath.   Cardiovascular: Negative for chest pain and palpitations.  Gastrointestinal: Negative for abdominal pain and vomiting.  Genitourinary: Negative for dysuria  and hematuria.  Musculoskeletal: Negative for arthralgias and back pain.  Skin: Negative for color change and rash.  Neurological: Negative for seizures and syncope.  All other systems reviewed and are negative.    Physical Exam Updated Vital Signs BP 111/62    Pulse 67    Temp 98.3 F (36.8 C) (Oral)    Resp 18    LMP 11/10/2018 Comment: TUBAL LIGATION   SpO2 99%   Physical Exam Vitals signs and nursing note reviewed.  Constitutional:      General: She is not in acute distress.    Appearance: She is well-developed. She is obese.     Comments: Non-ill-appearing, pleasant throughout conversation.  HENT:     Head: Normocephalic and atraumatic.     Mouth/Throat:     Pharynx: No oropharyngeal exudate.  Eyes:     Pupils: Pupils are equal, round, and reactive to light.  Neck:     Musculoskeletal: Normal range of motion.  Cardiovascular:     Rate and Rhythm: Regular rhythm.     Heart sounds: Normal heart sounds.     Comments: No pitting edema noted. Pulmonary:     Effort: Pulmonary effort is normal. No respiratory distress.     Breath sounds: Normal breath sounds. No decreased breath sounds.     Comments: Lungs are clear to auscultation, no wheezing, rales, rhonchi. Abdominal:     General: Bowel sounds are normal. There is no distension.     Palpations: Abdomen is soft.     Tenderness: There is no abdominal tenderness.  Musculoskeletal:        General: No tenderness or deformity.     Right lower leg: No edema.     Left lower leg: No edema.  Skin:    General: Skin is warm and dry.  Neurological:     Mental Status: She is alert and oriented to person, place, and time.     Comments: Alert, oriented, thought content appropriate. Speech fluent without evidence of aphasia. Able to follow 2 step commands without difficulty.  Cranial Nerves:  II:  Peripheral visual fields grossly normal, pupils, round, reactive to light III,IV, VI: ptosis not present, extra-ocular motions intact  bilaterally  V,VII: smile symmetric, facial light touch sensation equal VIII: hearing grossly normal bilaterally  IX,X: midline uvula rise  XI: bilateral shoulder shrug equal and strong XII: midline tongue extension  Motor:  5/5 in upper and lower extremities bilaterally including strong and equal grip strength and dorsiflexion/plantar flexion Sensory: light touch normal in all extremities.  Cerebellar: normal finger-to-nose with bilateral upper extremities, pronator drift negative Gait: normal gait and balance       ED Treatments / Results  Labs (all labs ordered are listed, but only abnormal results are displayed) Labs Reviewed  CBC WITH DIFFERENTIAL/PLATELET - Abnormal; Notable for the following components:      Result Value   WBC 12.0 (*)    Neutro Abs  7.8 (*)    All other components within normal limits  COMPREHENSIVE METABOLIC PANEL  TROPONIN I  BRAIN NATRIURETIC PEPTIDE  HCG, QUANTITATIVE, PREGNANCY  URINALYSIS, ROUTINE W REFLEX MICROSCOPIC    EKG EKG Interpretation  Date/Time:  Saturday Dec 07 2018 19:16:41 EDT Ventricular Rate:  77 PR Interval:    QRS Duration: 101 QT Interval:  348 QTC Calculation: 394 R Axis:   15 Text Interpretation:  Sinus rhythm Low voltage, precordial leads Borderline T abnormalities, anterior leads No significant change since last tracing Confirmed by Linwood Dibbles [phone]) on 12/07/2018 7:18:47 PM   Radiology Dg Chest 2 View  Result Date: 12/07/2018 CLINICAL DATA:  Chest pain EXAM: CHEST - 2 VIEW COMPARISON:  None. FINDINGS: The heart size and mediastinal contours are within normal limits. Both lungs are clear. The visualized skeletal structures are unremarkable. IMPRESSION: No active cardiopulmonary disease. Electronically Signed   By: Signa Kell M.D.   On: 12/07/2018 19:59   Ct Head Wo Contrast  Result Date: 12/07/2018 CLINICAL DATA:  Numbness or tingling. Paresthesia. EXAM: CT HEAD WITHOUT CONTRAST TECHNIQUE: Contiguous axial  images were obtained from the base of the skull through the vertex without intravenous contrast. COMPARISON:  Head CT 07/06/2014 FINDINGS: Brain: No intracranial hemorrhage, mass effect, or midline shift. No hydrocephalus. The basilar cisterns are patent. No evidence of territorial infarct or acute ischemia. No extra-axial or intracranial fluid collection. Vascular: No hyperdense vessel. Skull: No fracture or focal lesion. Sinuses/Orbits: Paranasal sinuses and mastoid air cells are clear. Previous paranasal sinus mucosal thickening has near completely resolved with only minimal residual opacification of the left ethmoid air cell. The visualized orbits are unremarkable. Other: None. IMPRESSION: Negative head CT. Electronically Signed   By: Narda Rutherford M.D.   On: 12/07/2018 20:08    Procedures Procedures (including critical care time)  Medications Ordered in ED Medications  metoCLOPramide (REGLAN) tablet 10 mg (10 mg Oral Given 12/07/18 2038)  diphenhydrAMINE (BENADRYL) capsule 25 mg (25 mg Oral Given 12/07/18 2037)  ketorolac (TORADOL) 15 MG/ML injection 15 mg (15 mg Intramuscular Given 12/07/18 2037)     Initial Impression / Assessment and Plan / ED Course  I have reviewed the triage vital signs and the nursing notes.  Pertinent labs & imaging results that were available during my care of the patient were reviewed by me and considered in my medical decision making (see chart for details).   Patient with no past medical history presents to the ED with complaints of chest pain, headache, left arm numbness which has been going on for the past week.  Reports she feels the symptoms worse when she is at work, she is currently employed as a Production designer, theatre/television/film at [employer], states she is has been under a lot of stress lately.  During evaluation patient is well-appearing, no acute distress, vital signs are stable, she is satting at 98% on room air.  Patient reports she googled some of her symptoms, states she  could perhaps be diabetic, be having a heart attack therefore came in for further evaluation.  Patient has not seen a PCP in several years, does not currently have one.  I will provide her a referral for the community health and wellness to establish primary care.  Patient will be provided with headache cocktail to help with her headache, she does report a history of migraines, not diagnosed but states when she gets migraine she gets blurry vision along with nausea and vomiting.  Has taken some Tylenol for her headache  which has improved her symptoms.  Heart Score 2  CMP showed no electrolyte abnormality, creatinine level is normal.  LFTs are unremarkable.  Glucose is normal as well.  CBC showed slight leukocytosis of 12.0, hemoglobin is stable, first troponin was negative.  P level was obtained as patient reports her mother had congestive heart failure, this is normal at this time. A CT head was obtained as patient reported some left arm tingling along with some blurry vision, CT was negative for any acute process such as hemorrhage, infarct. Chest x-ray showed no consolidation, pneumothorax, pleural effusion.   8:49 PM patient was provided with a headache cocktail, results of all her studies have been discussed with patient.  Patient understands it is imperative for her to obtain a primary care physician established.  Patient ports no complaints at this time, urinalysis along with pregnancy tests are currently pending.  9:10 PM patient signed out to [facility] pending urinalysis, hCG.  Patient is aware that she will likely be disposed home.  Portions of this note were generated with Scientist, clinical (histocompatibility and immunogenetics). Dictation errors may occur despite best attempts at proofreading.    Final Clinical Impressions(s) / ED Diagnoses   Final diagnoses:  Atypical chest pain  Bad headache    ED Discharge Orders    None       Claude Manges, Cordelia Poche 12/07/18 2110    Alvira Monday, MD 12/09/18 2342

## 2018-12-07 NOTE — ED Notes (Signed)
Patient transported to X-ray 

## 2018-12-07 NOTE — ED Provider Notes (Signed)
Presents with intermittent CP, headache and hand tingling.  Symptoms occur when she arrives at work, she reports increasing stress at work.  Work-up so far has been very reassuring.  Head CT is clear, labs are overall unremarkable.  UA and pregnancy test pending at handoff.  F/u UA and preg test, plan for discharge home, PCP follow up.  Physical Exam  BP 111/70   Pulse 74   Temp 98.3 F (36.8 C) (Oral)   Resp 17   LMP 11/10/2018 Comment: TUBAL LIGATION  SpO2 99%   Physical Exam Vitals signs and nursing note reviewed.  Constitutional:      General: She is not in acute distress.    Appearance: She is well-developed. She is not diaphoretic.  HENT:     Head: Normocephalic and atraumatic.  Eyes:     General:        Right eye: No discharge.        Left eye: No discharge.  Pulmonary:     Effort: Pulmonary effort is normal. No respiratory distress.  Neurological:     Mental Status: She is alert.     Coordination: Coordination normal.  Psychiatric:        Behavior: Behavior normal.     ED Course/Procedures   Labs Reviewed  CBC WITH DIFFERENTIAL/PLATELET - Abnormal; Notable for the following components:      Result Value   WBC 12.0 (*)    Neutro Abs 7.8 (*)    All other components within normal limits  URINALYSIS, ROUTINE W REFLEX MICROSCOPIC - Abnormal; Notable for the following components:   Leukocytes,Ua TRACE (*)    Bacteria, UA RARE (*)    All other components within normal limits  COMPREHENSIVE METABOLIC PANEL  TROPONIN I  BRAIN NATRIURETIC PEPTIDE  HCG, QUANTITATIVE, PREGNANCY   Dg Chest 2 View  Result Date: 12/07/2018 CLINICAL DATA:  Chest pain EXAM: CHEST - 2 VIEW COMPARISON:  None. FINDINGS: The heart size and mediastinal contours are within normal limits. Both lungs are clear. The visualized skeletal structures are unremarkable. IMPRESSION: No active cardiopulmonary disease. Electronically Signed   By: Signa Kell M.D.   On: 12/07/2018 19:59   Ct Head Wo  Contrast  Result Date: 12/07/2018 CLINICAL DATA:  Numbness or tingling. Paresthesia. EXAM: CT HEAD WITHOUT CONTRAST TECHNIQUE: Contiguous axial images were obtained from the base of the skull through the vertex without intravenous contrast. COMPARISON:  Head CT 07/06/2014 FINDINGS: Brain: No intracranial hemorrhage, mass effect, or midline shift. No hydrocephalus. The basilar cisterns are patent. No evidence of territorial infarct or acute ischemia. No extra-axial or intracranial fluid collection. Vascular: No hyperdense vessel. Skull: No fracture or focal lesion. Sinuses/Orbits: Paranasal sinuses and mastoid air cells are clear. Previous paranasal sinus mucosal thickening has near completely resolved with only minimal residual opacification of the left ethmoid air cell. The visualized orbits are unremarkable. Other: None. IMPRESSION: Negative head CT. Electronically Signed   By: Narda Rutherford M.D.   On: 12/07/2018 20:08   EKG Interpretation  Date/Time:  Saturday Dec 07 2018 19:16:41 EDT Ventricular Rate:  77 PR Interval:    QRS Duration: 101 QT Interval:  348 QTC Calculation: 394 R Axis:   15 Text Interpretation:  Sinus rhythm Low voltage, precordial leads Borderline T abnormalities, anterior leads No significant change since last tracing Confirmed by Linwood Dibbles [phone]) on 12/07/2018 7:18:47 PM  Procedures  MDM   Patient's urinalysis is unremarkable and pregnancy test is negative.  The rest of the patient's work-up  has been reassuring per previous provider.  At this time she is stable for discharge home, encouraged to schedule an appointment with the PCP for follow-up.       Dartha LodgeFord, Roosevelt Eimers N, PA-C 12/07/18 2200    Alvira MondaySchlossman, Erin, MD 12/09/18 [phone]

## 2018-12-07 NOTE — Discharge Instructions (Addendum)
Your laboratory results were within normal limits today. I have provided a referral to the community health and wellness clinic, please schedule an appointment to establish primary care, it is very important for you to have somebody that follows your health care.  Please return to the emergency department if you experience any chest pain, worsening symptoms or shortness of breath.

## 2018-12-07 NOTE — ED Triage Notes (Signed)
Pt states she has had left sided chest pain intermittently x 1 week, states it feels like heartburn, thinks it could be stress related, also reports some dizziness with pain radiating to left leg.

## 2019-08-19 ENCOUNTER — Other Ambulatory Visit: Payer: Self-pay

## 2019-08-19 ENCOUNTER — Emergency Department (HOSPITAL_COMMUNITY)
Admission: EM | Admit: 2019-08-19 | Discharge: 2019-08-20 | Disposition: A | Discharge status: Home / Self Care | Payer: BC Managed Care – PPO | Attending: Emergency Medicine | Admitting: Emergency Medicine

## 2019-08-19 ENCOUNTER — Encounter (HOSPITAL_COMMUNITY): Payer: Self-pay | Admitting: Emergency Medicine

## 2019-08-19 DIAGNOSIS — Z20822 Contact with and (suspected) exposure to covid-19: Secondary | ICD-10-CM

## 2019-08-19 DIAGNOSIS — U071 COVID-19: Secondary | ICD-10-CM | POA: Insufficient documentation

## 2019-08-19 DIAGNOSIS — Z79899 Other long term (current) drug therapy: Secondary | ICD-10-CM | POA: Insufficient documentation

## 2019-08-19 DIAGNOSIS — F17210 Nicotine dependence, cigarettes, uncomplicated: Secondary | ICD-10-CM | POA: Diagnosis not present

## 2019-08-19 DIAGNOSIS — R509 Fever, unspecified: Secondary | ICD-10-CM | POA: Diagnosis present

## 2019-08-19 NOTE — ED Triage Notes (Signed)
Pt reports she feels like her body is burning on the inside, and she has lost her sense of smell and taste. Both of her children are COVID+. Pt states she tested negative.

## 2019-08-20 MED ORDER — LIDOCAINE VISCOUS HCL 2 % MT SOLN
15.0000 mL | Freq: Once | OROMUCOSAL | Status: AC
  Administered 2019-08-20: 03:00:00 15 mL via ORAL
  Filled 2019-08-20: qty 15

## 2019-08-20 MED ORDER — ALUM & MAG HYDROXIDE-SIMETH 200-200-20 MG/5ML PO SUSP
30.0000 mL | Freq: Once | ORAL | Status: AC
  Administered 2019-08-20: 30 mL via ORAL
  Filled 2019-08-20: qty 30

## 2019-08-20 NOTE — ED Provider Notes (Signed)
[facility] EMERGENCY DEPARTMENT Provider Note   CSN: [id number] Arrival date & time: 08/19/19  2308     History Chief Complaint  Patient presents with  . Fever    Cynthia Mcclure is a 39 y.o. female.   39 year old female with a history of anemia and obesity presents to the emergency department expressing concern for COVID-19 infection.  States both of her children recently tested positive for COVID.  She had a negative COVID test approximately 10 days ago.  Reports losing both smell and taste 2 to 3 days ago.  Was resting at home when she began to feel a tingling sensation throughout her body, as though she was "burning on the inside".  Never checked her temperature and is unaware of any fevers.  Denies taking any medications prior to arrival.  States that this sensation has since subsided.  She is currently feeling symptoms of reflux which she has experienced in the past.  Denies shortness of breath, cough, syncope, hemoptysis, vomiting or diarrhea.  The history is provided by the patient. No language interpreter was used.  Fever      Past Medical History:  Diagnosis Date  . Anemia     Patient Active Problem List   Diagnosis Date Noted  . OBESITY, NOS 09/20/2006  . TOBACCO DEPENDENCE 09/20/2006  . DEPRESSIVE DISORDER, NOS 09/20/2006  . RHINITIS, ALLERGIC 09/20/2006    Past Surgical History:  Procedure Laterality Date  . TUBAL LIGATION       OB History   No obstetric history on file.     Family History  Problem Relation Age of Onset  . Diabetes Other   . Hypertension Other   . Lupus Other     Social History   Tobacco Use  . Smoking status: Current Every Day Smoker    Packs/day: 0.25    Types: Cigarettes  . Smokeless tobacco: Never Used  Substance Use Topics  . Alcohol use: Yes    Comment: socially  . Drug use: No    Home Medications Prior to Admission medications   Medication Sig Start Date End Date Taking? Authorizing  Provider  clindamycin (CLEOCIN) 300 MG capsule Take 300 mg by mouth 3 (three) times daily.    [provider]  hydrochlorothiazide (HYDRODIURIL) 25 MG tablet Take 1 tablet (25 mg total) by mouth daily. Patient not taking: Reported on 08/06/2016 03/10/16   Dione Booze, MD  ibuprofen (ADVIL,MOTRIN) 800 MG tablet Take 800 mg by mouth every 8 (eight) hours as needed. For pain    [provider]    Allergies    Penicillins  Review of Systems   Review of Systems  Constitutional: Positive for fever.  Ten systems reviewed and are negative for acute change, except as noted in the HPI.    Physical Exam Updated Vital Signs BP 125/83 (BP Location: Right Arm)   Pulse (!) 55   Temp 97.7 F (36.5 C) (Oral)   Resp 15   SpO2 100%   Physical Exam Vitals and nursing note reviewed.  Constitutional:      General: She is not in acute distress.    Appearance: She is well-developed. She is not diaphoretic.     Comments: Nontoxic appearing, obese female.  HENT:     Head: Normocephalic and atraumatic.  Eyes:     General: No scleral icterus.    Conjunctiva/sclera: Conjunctivae normal.  Pulmonary:     Effort: Pulmonary effort is normal. No respiratory distress.  Breath sounds: No stridor. No wheezing.     Comments: Respirations even and unlabored Musculoskeletal:        General: Normal range of motion.     Cervical back: Normal range of motion.  Skin:    General: Skin is warm and dry.     Coloration: Skin is not pale.     Findings: No erythema or rash.  Neurological:     Mental Status: She is alert and oriented to person, place, and time.     Coordination: Coordination normal.  Psychiatric:        Behavior: Behavior normal.     ED Results / Procedures / Treatments   Labs (all labs ordered are listed, but only abnormal results are displayed) Labs Reviewed  NOVEL CORONAVIRUS, NAA (HOSP ORDER, SEND-OUT TO REF LAB; TAT 18-24 HRS)    EKG None  Radiology No results  found.  Procedures Procedures (including critical care time)  Medications Ordered in ED Medications  alum & mag hydroxide-simeth (MAALOX/MYLANTA) 200-200-20 MG/5ML suspension 30 mL (30 mLs Oral Given 08/20/19 0238)    And  lidocaine (XYLOCAINE) 2 % viscous mouth solution 15 mL (15 mLs Oral Given 08/20/19 1638)    ED Course  I have reviewed the triage vital signs and the nursing notes.  Pertinent labs & imaging results that were available during my care of the patient were reviewed by me and considered in my medical decision making (see chart for details).    MDM Rules/Calculators/A&P                      39 year old female presents the emergency department expressing concern for infection of COVID-19.  Reports that both of her children recently tested positive.  She does have history of negative testing approximately 10 days ago.  Clinically, the patient appears extremely well.  She has no signs of respiratory distress.  Vitals have been stable and she is afebrile in the ED.  Her lungs are clear to auscultation bilaterally.  No hypoxia.  Will send repeat outpatient COVID swab, but do not feel further emergent work-up is indicated.  Counseled on outpatient supportive management.  Return precautions discussed and provided. Patient discharged in stable condition with no unaddressed concerns.  Cynthia Mcclure was evaluated in Emergency Department on 08/20/2019 for the symptoms described in the history of present illness. She was evaluated in the context of the global COVID-19 pandemic, which necessitated consideration that the patient might be at risk for infection with the SARS-CoV-2 virus that causes COVID-19. Institutional protocols and algorithms that pertain to the evaluation of patients at risk for COVID-19 are in a state of rapid change based on information released by regulatory bodies including the CDC and federal and state organizations. These policies and algorithms were followed  during the patient's care in the ED.   Final Clinical Impression(s) / ED Diagnoses Final diagnoses:  Contact with and (suspected) exposure to covid-19    Rx / DC Orders ED Discharge Orders    None       Antonietta Breach, PA-C 08/20/19 0529    Merrily Pew, MD 08/20/19 [phone]

## 2019-08-21 LAB — NOVEL CORONAVIRUS, NAA (HOSP ORDER, SEND-OUT TO REF LAB; TAT 18-24 HRS): SARS-CoV-2, NAA: DETECTED — AB

## 2020-04-15 IMAGING — CR CHEST - 2 VIEW
2 series · 2 of 2 positions shown · non-contrast
Comparison: None.

CLINICAL DATA: Chest pain

EXAM:
CHEST - 2 VIEW

[chest ap]
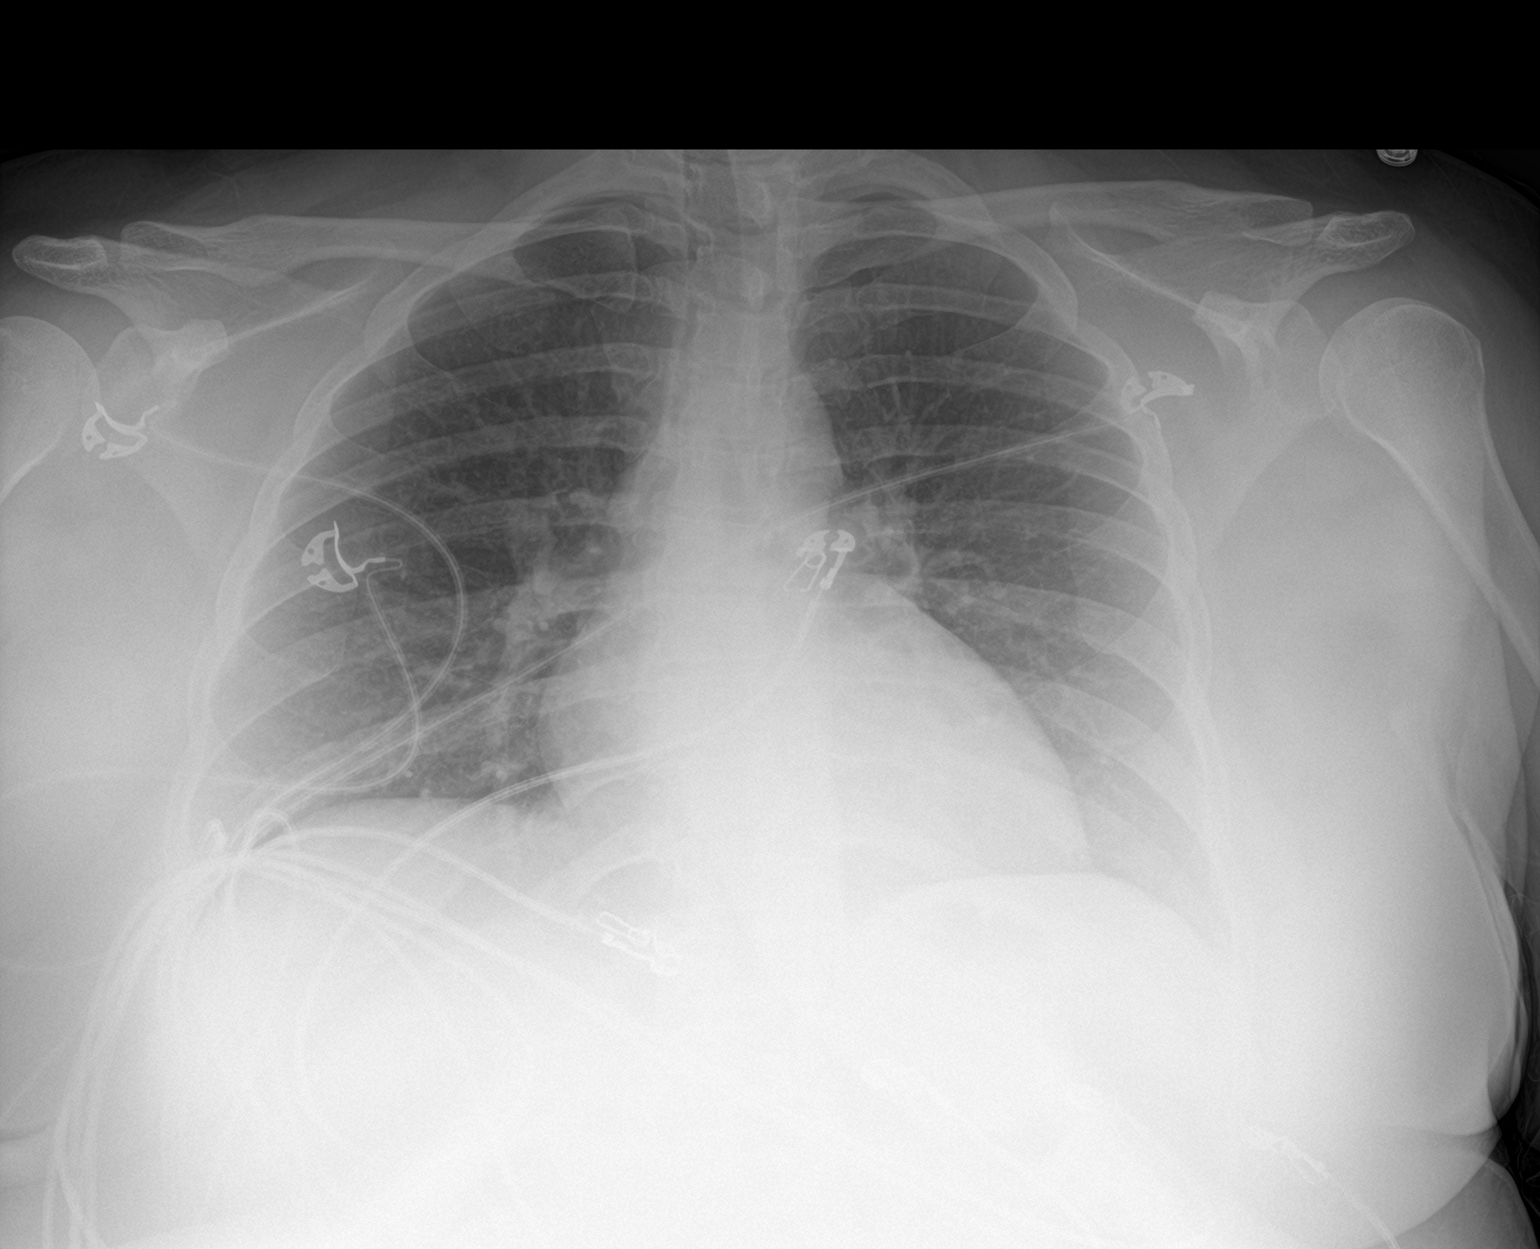

[chest lat]
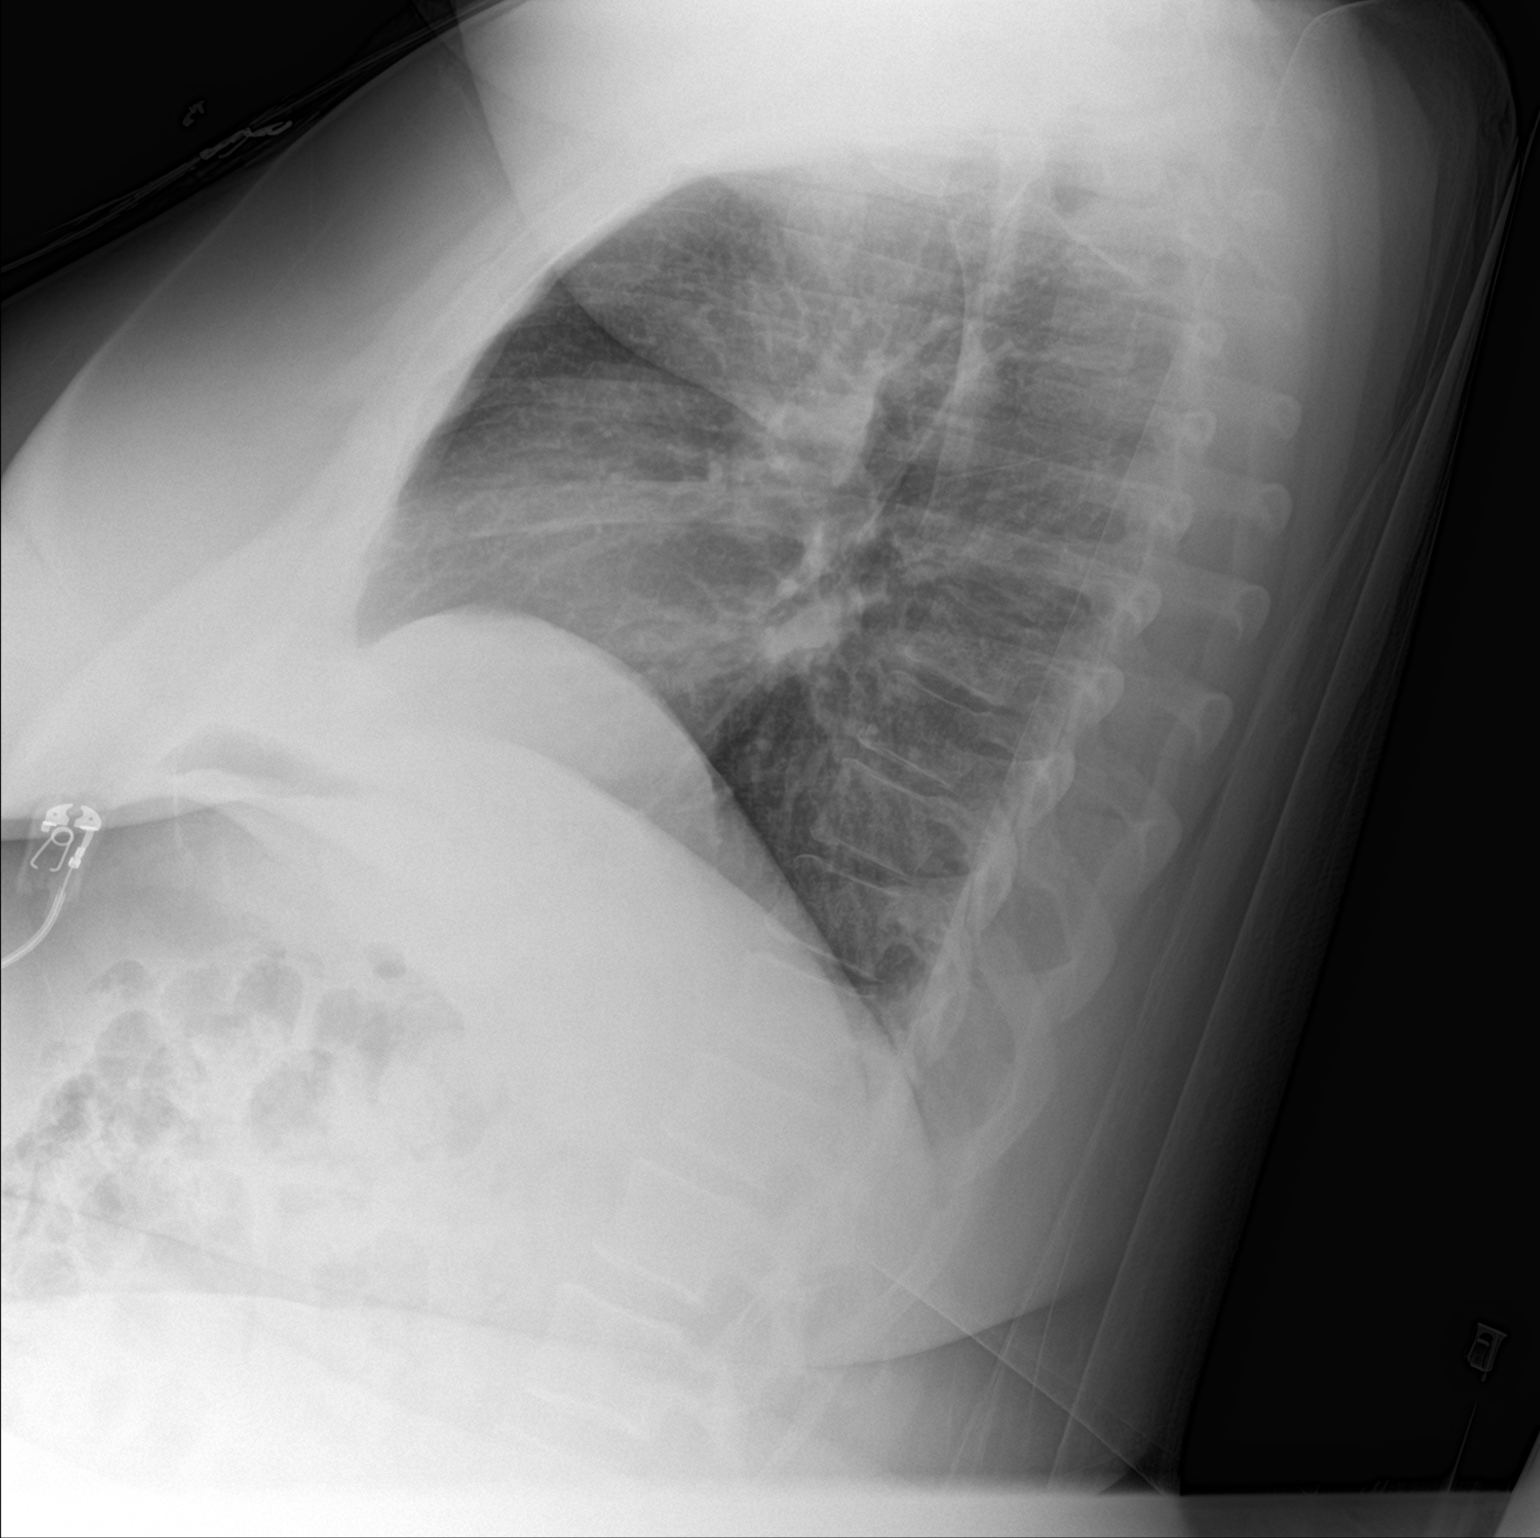

[2 of 2 positions shown; findings below may reference images not displayed]

FINDINGS: The heart size and mediastinal contours are within normal limits.
Both lungs are clear. The visualized skeletal structures are
unremarkable.
IMPRESSION: No active cardiopulmonary disease.

## 2020-04-15 IMAGING — CT CT HEAD WITHOUT CONTRAST
4 series · 17 of 47 positions shown, 19 images · non-contrast
Comparison: Head CT 07/06/2014

CLINICAL DATA: Numbness or tingling. Paresthesia.

EXAM:
CT HEAD WITHOUT CONTRAST
TECHNIQUE: Contiguous axial images were obtained from the base of the skull
through the vertex without intravenous contrast.

[Series 3: head without · axial · non-contrast · 0.44mm/px · z∈[-72,+48]mm · 7 of 33 slices shown, 9 images]
[im 5/33  brain]
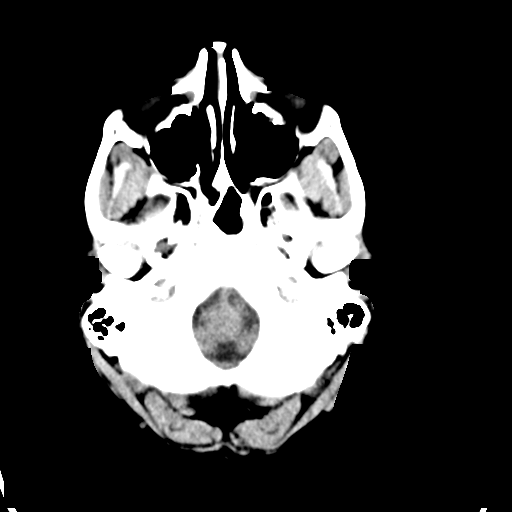
[im 5/33  bone]
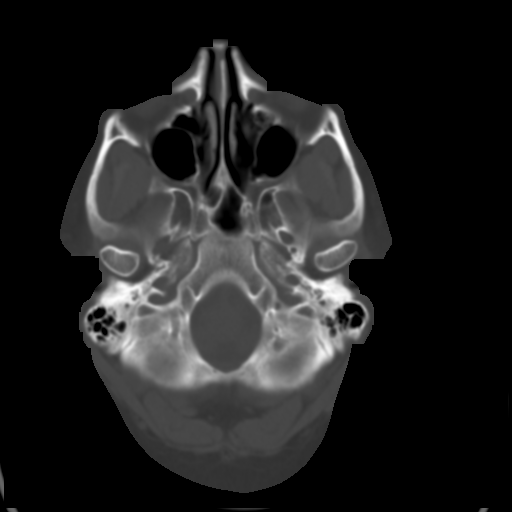
[im 9/33  brain]
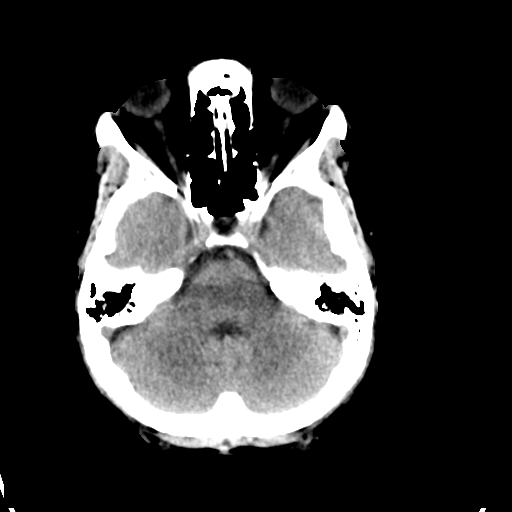
[im 13/33  brain]
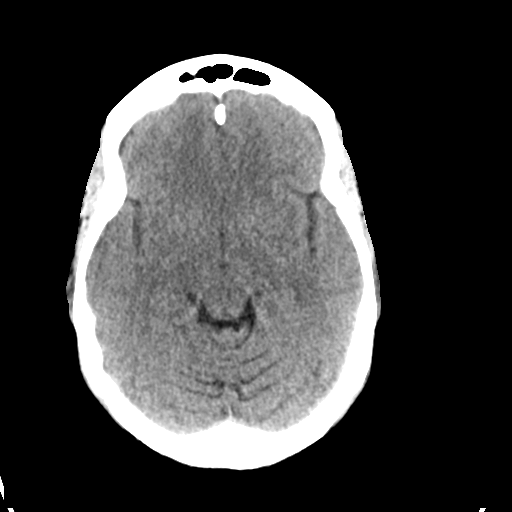
[im 17/33  brain]
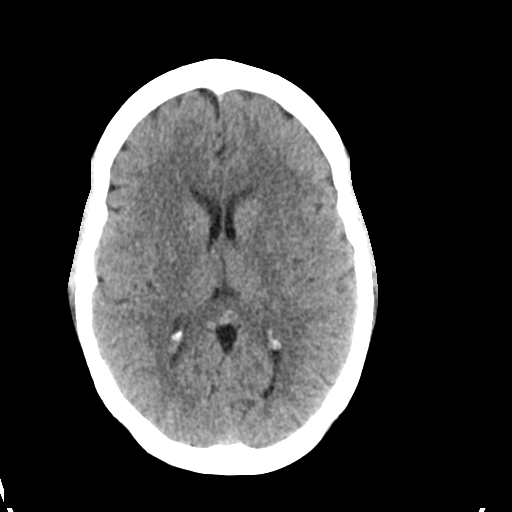
[im 21/33  brain]
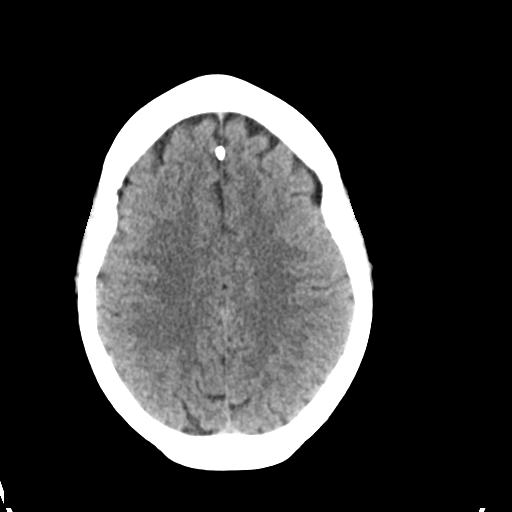
[im 21/33  bone]
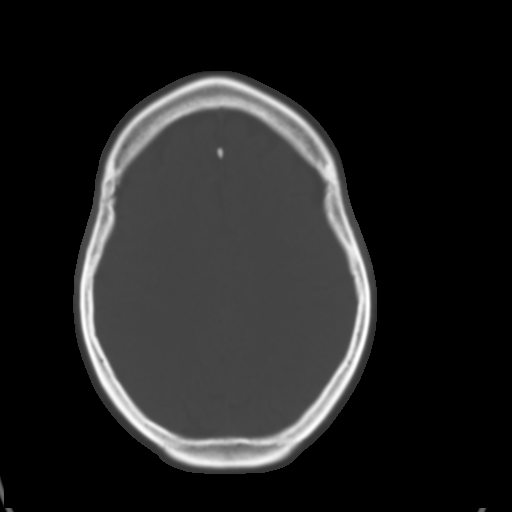
[im 25/33  brain]
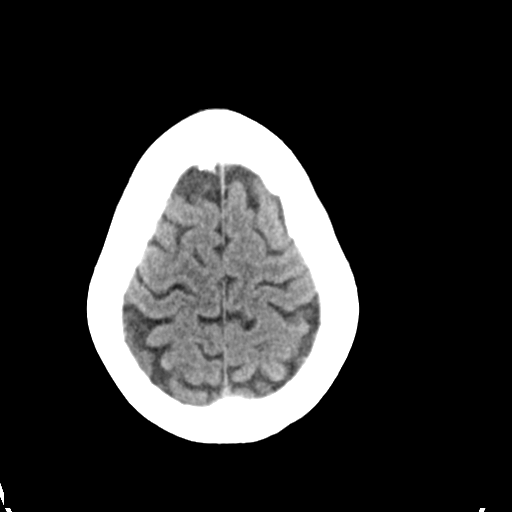
[im 29/33  brain]
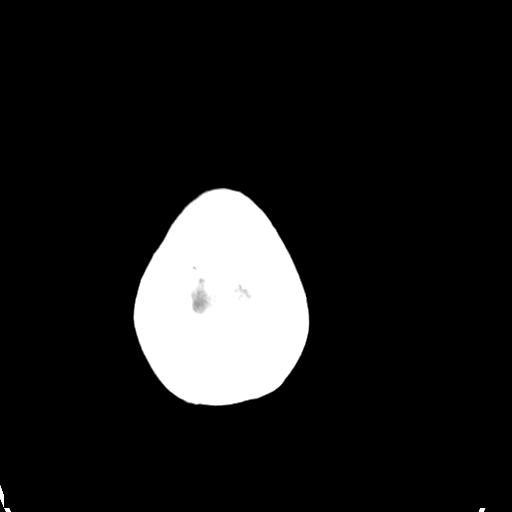

[Series 4: head bone · axial · 0.44mm/px · z∈[-76,-20]mm · 4 of 81 slices shown]
[im 9/81  bone]
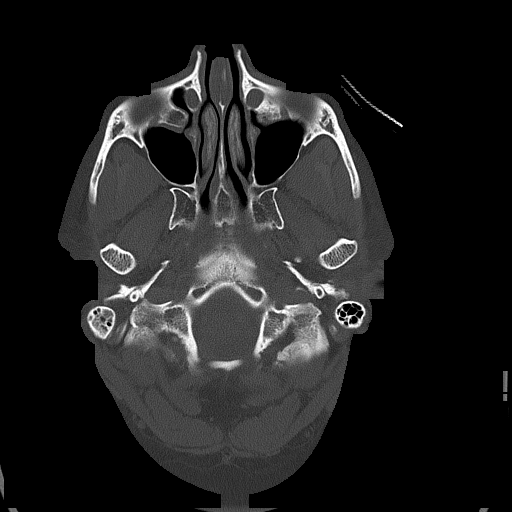
[im 17/81  bone]
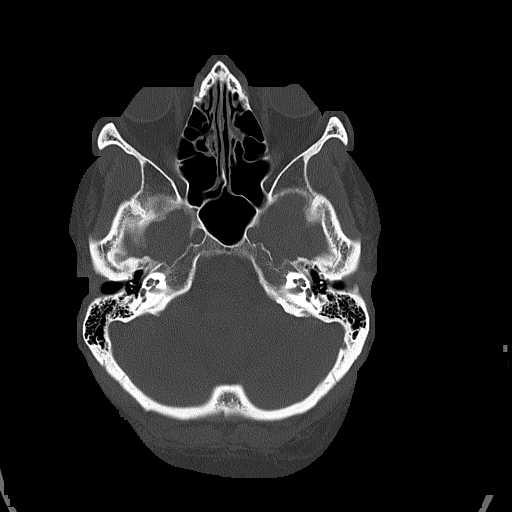
[im 25/81  bone]
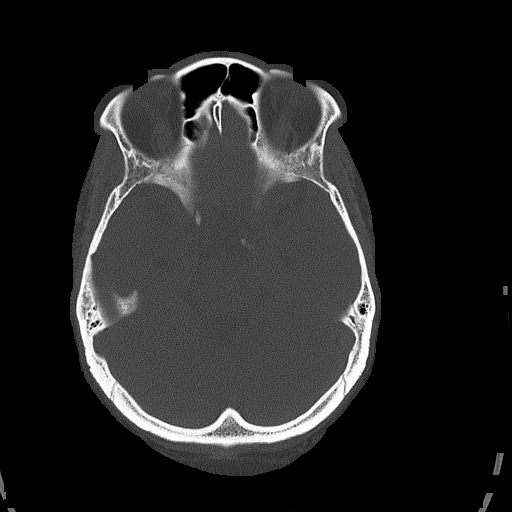
[im 37/81  bone]
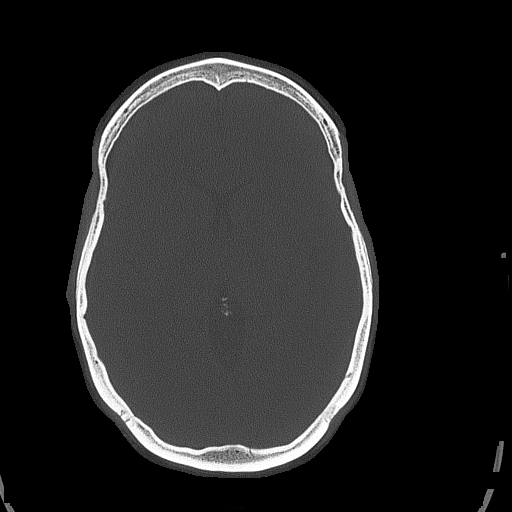

[Series 5: head without cor · coronal · non-contrast · 0.38mm/px · 3 of 72 slices shown]
[im 24/72  brain]
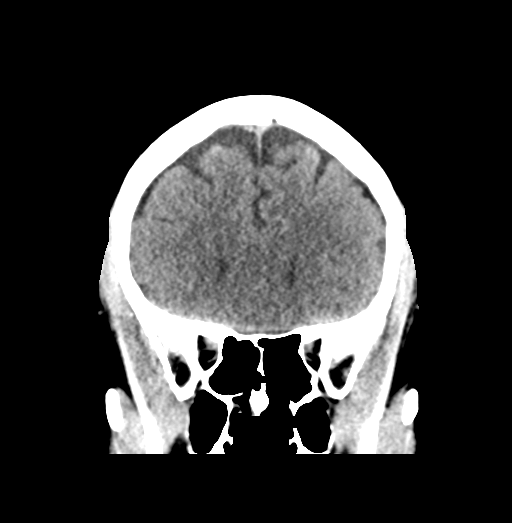
[im 32/72  brain]
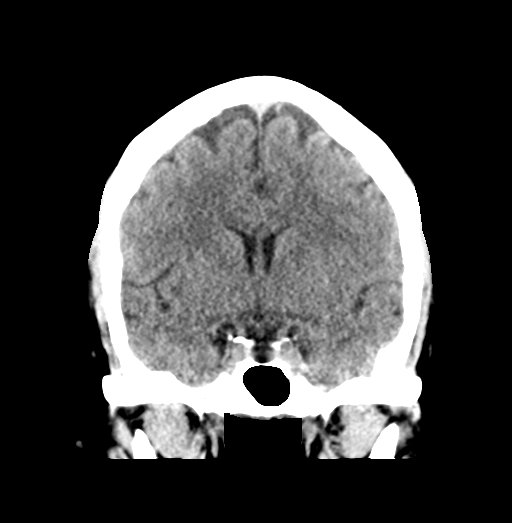
[im 40/72  brain]
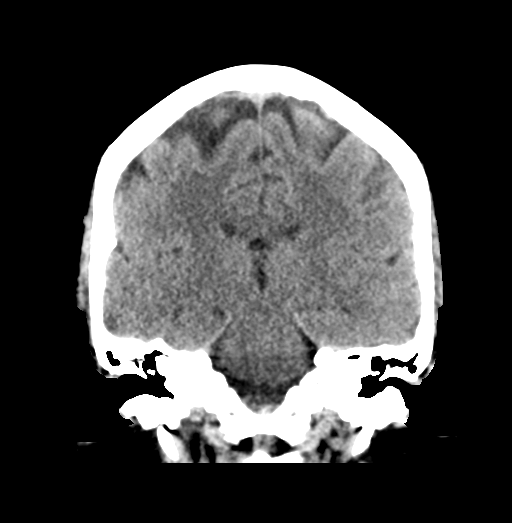

[Series 6: head without sag · sagittal · non-contrast · 0.35mm/px · 3 of 65 slices shown]
[im 22/65  brain]
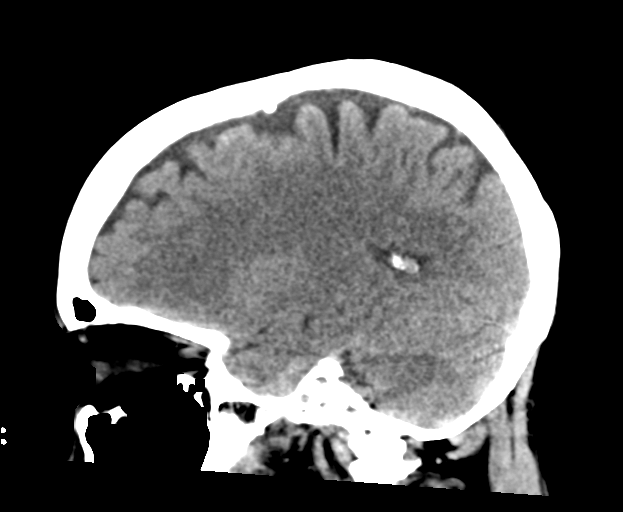
[im 33/65  brain]
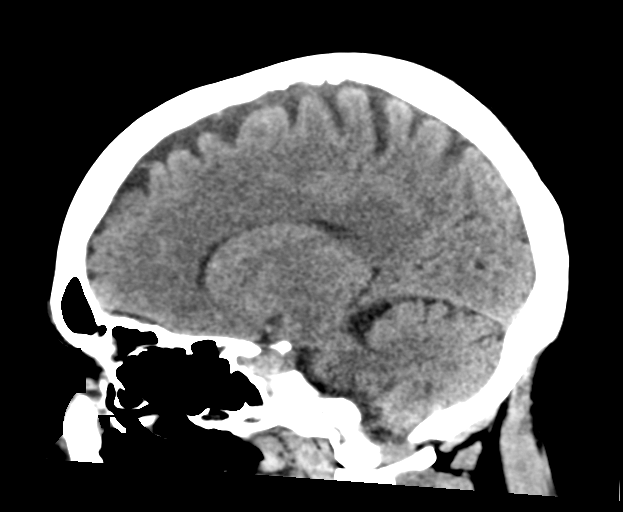
[im 43/65  brain]
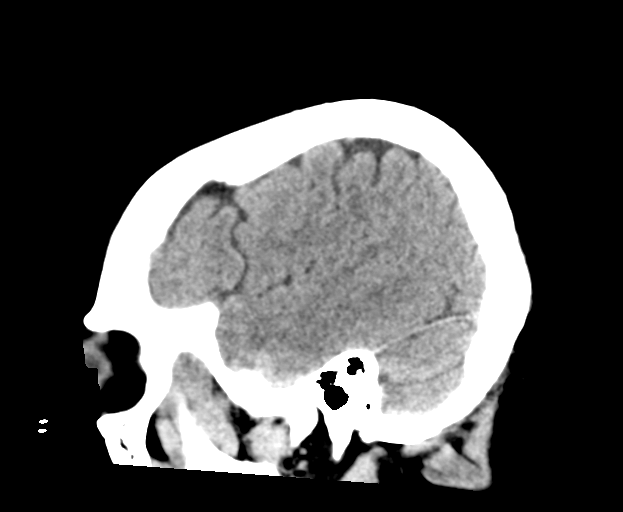

[17 of 47 positions shown; findings below may reference images not displayed]

FINDINGS: Brain: No intracranial hemorrhage, mass effect, or midline shift. No
hydrocephalus. The basilar cisterns are patent. No evidence of
territorial infarct or acute ischemia. No extra-axial or
intracranial fluid collection.

Vascular: No hyperdense vessel.

Skull: No fracture or focal lesion.

Sinuses/Orbits: Paranasal sinuses and mastoid air cells are clear.
Previous paranasal sinus mucosal thickening has near completely
resolved with only minimal residual opacification of the left
ethmoid air cell. The visualized orbits are unremarkable.

Other: None.
IMPRESSION: Negative head CT.

## 2023-10-25 ENCOUNTER — Ambulatory Visit (HOSPITAL_COMMUNITY): Admission: EM | Admit: 2023-10-25 | Discharge: 2023-10-25 | Disposition: A | Discharge status: Home / Self Care

## 2023-10-25 ENCOUNTER — Encounter (HOSPITAL_COMMUNITY): Payer: Self-pay | Admitting: Emergency Medicine

## 2023-10-25 DIAGNOSIS — J029 Acute pharyngitis, unspecified: Secondary | ICD-10-CM | POA: Insufficient documentation

## 2023-10-25 LAB — POCT RAPID STREP A (OFFICE): Rapid Strep A Screen: NEGATIVE

## 2023-10-25 MED ORDER — IBUPROFEN 800 MG PO TABS: 800.0000 mg | ORAL_TABLET | Freq: Three times a day (TID) | ORAL | 1 refills | Status: AC | PRN

## 2023-10-25 MED ORDER — CEPHALEXIN 500 MG PO CAPS: 500.0000 mg | ORAL_CAPSULE | Freq: Two times a day (BID) | ORAL | 0 refills | Status: AC

## 2023-10-25 NOTE — Discharge Instructions (Addendum)
 1. Acute pharyngitis, unspecified etiology (Primary) - POC rapid strep A completed in UC is negative for strep pharyngitis - ibuprofen (ADVIL) 800 MG tablet; Take 1 tablet (800 mg total) by mouth every 8 (eight) hours as needed. For pain  Dispense: 30 tablet; Refill: 1 - Culture, group A strep (throat) collected and sent to lab for further testing results should be available in 2 to 3 days. -Prescription for cephalexin 500 mg twice daily for 10 days sent to preferred pharmacy for treatment of acute pharyngitis.

## 2023-10-25 NOTE — ED Provider Notes (Signed)
 [facility]  Note:  This document was prepared using Dragon voice recognition software and may include unintentional dictation errors.  MRN: [id number] DOB: Dec 02, 1980  Subjective:   Cynthia Mcclure is a 43 y.o. female presenting for sore throat with white spots on her tonsils.  Patient started having symptoms last night.  Reports history of strep pharyngitis.  Has not taken any over-the-counter medication to treat symptoms.  No known exposure to anyone positive for strep.  No other secondary symptoms such as cough, body aches, nausea and vomiting.  No current facility-administered medications for this encounter.  Current Outpatient Medications:    cephALEXin (KEFLEX) 500 MG capsule, Take 1 capsule (500 mg total) by mouth 2 (two) times daily for 10 days., Disp: 20 capsule, Rfl: 0   hydrochlorothiazide (HYDRODIURIL) 25 MG tablet, Take 1 tablet (25 mg total) by mouth daily. (Patient not taking: Reported on 08/06/2016), Disp: 15 tablet, Rfl: 0   ibuprofen (ADVIL) 800 MG tablet, Take 1 tablet (800 mg total) by mouth every 8 (eight) hours as needed. For pain, Disp: 30 tablet, Rfl: 1   Allergies  Allergen Reactions   Penicillins     REACTION: Unknown reaction - mother told her not to take it.    Past Medical History:  Diagnosis Date   Anemia      Past Surgical History:  Procedure Laterality Date   TUBAL LIGATION      Family History  Problem Relation Age of Onset   Diabetes Other    Hypertension Other    Lupus Other     Social History   Tobacco Use   Smoking status: Every Day    Current packs/day: 0.25    Types: Cigarettes   Smokeless tobacco: Never  Substance Use Topics   Alcohol use: Yes    Comment: socially   Drug use: No    ROS Refer to HPI for ROS details.  Objective:   Vitals: BP 110/62 (BP Location: Left Arm)   Pulse 79   Temp 99.8 F (37.7 C) (Oral)   Resp 17   LMP 10/25/2023   SpO2 100%   Physical Exam Vitals and nursing note  reviewed.  Constitutional:      General: She is not in acute distress.    Appearance: Normal appearance. She is well-developed. She is not ill-appearing or toxic-appearing.  HENT:     Head: Normocephalic.     Mouth/Throat:     Mouth: Mucous membranes are moist.     Pharynx: Oropharyngeal exudate and posterior oropharyngeal erythema present.  Cardiovascular:     Rate and Rhythm: Normal rate.  Pulmonary:     Effort: Pulmonary effort is normal. No respiratory distress.  Skin:    General: Skin is warm and dry.  Neurological:     General: No focal deficit present.     Mental Status: She is alert and oriented to person, place, and time.  Psychiatric:        Mood and Affect: Mood normal.     Procedures  Results for orders placed or performed during the hospital encounter of 10/25/23 (from the past 24 hours)  POC rapid strep A     Status: None   Collection Time: 10/25/23  7:57 PM  Result Value Ref Range   Rapid Strep A Screen Negative Negative    Assessment and Plan :   PDMP not reviewed this encounter.  1. Acute pharyngitis, unspecified etiology    1. Acute pharyngitis, unspecified etiology (Primary) - POC rapid  strep A completed in UC is negative for strep pharyngitis - ibuprofen (ADVIL) 800 MG tablet; Take 1 tablet (800 mg total) by mouth every 8 (eight) hours as needed. For pain  Dispense: 30 tablet; Refill: 1 - Culture, group A strep (throat) collected and sent to lab for further testing results should be available in 2 to 3 days. -Prescription for cephalexin 500 mg twice daily for 10 days sent to preferred pharmacy for treatment of acute pharyngitis. -Continue to monitor symptoms for any change in severity if there is any escalation of current symptoms or development of new symptoms follow-up in ER for further evaluation and management.  Lucky Cowboy   [place], [place] [address], [place] 10/25/23 2004

## 2023-10-25 NOTE — ED Triage Notes (Signed)
 Since last night having sore throat, swelling, Pus drainage in tonsils. Hx strep throat.

## 2023-10-29 ENCOUNTER — Encounter (HOSPITAL_COMMUNITY): Payer: Self-pay

## 2023-10-29 LAB — CULTURE, GROUP A STREP (THRC)
